# Patient Record
Sex: Male | Born: 1935 | Race: White | Hispanic: No | Marital: Married | Smoking: Former smoker
Health system: Southern US, Community
[De-identification: ages and names within clinical notes are randomized; demographics above are authoritative.]

## PROBLEM LIST (undated history)

## (undated) DIAGNOSIS — I219 Acute myocardial infarction, unspecified: Secondary | ICD-10-CM

## (undated) DIAGNOSIS — E039 Hypothyroidism, unspecified: Secondary | ICD-10-CM

## (undated) DIAGNOSIS — E119 Type 2 diabetes mellitus without complications: Secondary | ICD-10-CM

## (undated) DIAGNOSIS — I2510 Atherosclerotic heart disease of native coronary artery without angina pectoris: Secondary | ICD-10-CM

## (undated) DIAGNOSIS — E785 Hyperlipidemia, unspecified: Secondary | ICD-10-CM

## (undated) DIAGNOSIS — E079 Disorder of thyroid, unspecified: Secondary | ICD-10-CM

## (undated) DIAGNOSIS — I10 Essential (primary) hypertension: Secondary | ICD-10-CM

## (undated) HISTORY — DX: Essential (primary) hypertension: I10

## (undated) HISTORY — DX: Hyperlipidemia, unspecified: E78.5

## (undated) HISTORY — PX: CARDIAC CATHETERIZATION: SHX172

## (undated) HISTORY — DX: Atherosclerotic heart disease of native coronary artery without angina pectoris: I25.10

## (undated) HISTORY — DX: Type 2 diabetes mellitus without complications: E11.9

## (undated) HISTORY — DX: Acute myocardial infarction, unspecified: I21.9

## (undated) HISTORY — PX: CATARACT EXTRACTION W/ INTRAOCULAR LENS IMPLANT: SHX1309

## (undated) HISTORY — PX: HERNIA REPAIR: SHX51

## (undated) HISTORY — DX: Disorder of thyroid, unspecified: E07.9

## (undated) HISTORY — PX: STENT PLACEMENT VASCULAR (ARMC HX): HXRAD1737

---

## 1997-11-16 ENCOUNTER — Other Ambulatory Visit: Admission: RE | Admit: 1997-11-16 | Discharge: 1997-11-16 | Payer: Self-pay

## 1998-08-25 ENCOUNTER — Encounter: Payer: Self-pay | Admitting: Emergency Medicine

## 1998-08-25 ENCOUNTER — Inpatient Hospital Stay (HOSPITAL_COMMUNITY): Admission: EM | Admit: 1998-08-25 | Discharge: 1998-08-28 | Payer: Self-pay | Admitting: Emergency Medicine

## 1998-08-30 ENCOUNTER — Encounter: Admission: RE | Admit: 1998-08-30 | Discharge: 1998-08-30 | Payer: Self-pay | Admitting: Family Medicine

## 2001-07-29 ENCOUNTER — Inpatient Hospital Stay (HOSPITAL_COMMUNITY): Admission: EM | Admit: 2001-07-29 | Discharge: 2001-08-02 | Payer: Self-pay | Admitting: *Deleted

## 2001-07-29 ENCOUNTER — Encounter: Payer: Self-pay | Admitting: Emergency Medicine

## 2001-07-30 ENCOUNTER — Encounter: Payer: Self-pay | Admitting: Cardiology

## 2007-03-07 ENCOUNTER — Emergency Department: Payer: Self-pay | Admitting: Emergency Medicine

## 2007-03-08 ENCOUNTER — Other Ambulatory Visit: Payer: Self-pay

## 2014-10-24 DIAGNOSIS — H2510 Age-related nuclear cataract, unspecified eye: Secondary | ICD-10-CM | POA: Diagnosis not present

## 2014-10-24 DIAGNOSIS — H4010X1 Unspecified open-angle glaucoma, mild stage: Secondary | ICD-10-CM | POA: Diagnosis not present

## 2014-11-07 DIAGNOSIS — E039 Hypothyroidism, unspecified: Secondary | ICD-10-CM | POA: Diagnosis not present

## 2014-11-07 DIAGNOSIS — G039 Meningitis, unspecified: Secondary | ICD-10-CM | POA: Diagnosis not present

## 2014-11-07 DIAGNOSIS — Z6827 Body mass index (BMI) 27.0-27.9, adult: Secondary | ICD-10-CM | POA: Diagnosis not present

## 2014-11-07 DIAGNOSIS — E785 Hyperlipidemia, unspecified: Secondary | ICD-10-CM | POA: Diagnosis not present

## 2014-11-07 DIAGNOSIS — N183 Chronic kidney disease, stage 3 (moderate): Secondary | ICD-10-CM | POA: Diagnosis not present

## 2014-11-07 DIAGNOSIS — I10 Essential (primary) hypertension: Secondary | ICD-10-CM | POA: Diagnosis not present

## 2014-11-14 DIAGNOSIS — Z6827 Body mass index (BMI) 27.0-27.9, adult: Secondary | ICD-10-CM | POA: Diagnosis not present

## 2014-11-14 DIAGNOSIS — E119 Type 2 diabetes mellitus without complications: Secondary | ICD-10-CM | POA: Diagnosis not present

## 2014-12-12 DIAGNOSIS — Z6826 Body mass index (BMI) 26.0-26.9, adult: Secondary | ICD-10-CM | POA: Diagnosis not present

## 2014-12-12 DIAGNOSIS — E1129 Type 2 diabetes mellitus with other diabetic kidney complication: Secondary | ICD-10-CM | POA: Diagnosis not present

## 2014-12-12 DIAGNOSIS — I2510 Atherosclerotic heart disease of native coronary artery without angina pectoris: Secondary | ICD-10-CM | POA: Diagnosis not present

## 2015-01-31 ENCOUNTER — Ambulatory Visit: Payer: Self-pay | Admitting: Cardiology

## 2015-03-26 DIAGNOSIS — N400 Enlarged prostate without lower urinary tract symptoms: Secondary | ICD-10-CM | POA: Diagnosis not present

## 2015-03-26 DIAGNOSIS — E119 Type 2 diabetes mellitus without complications: Secondary | ICD-10-CM | POA: Diagnosis not present

## 2015-03-26 DIAGNOSIS — Z6826 Body mass index (BMI) 26.0-26.9, adult: Secondary | ICD-10-CM | POA: Diagnosis not present

## 2015-03-26 DIAGNOSIS — N183 Chronic kidney disease, stage 3 (moderate): Secondary | ICD-10-CM | POA: Diagnosis not present

## 2015-03-26 DIAGNOSIS — I2510 Atherosclerotic heart disease of native coronary artery without angina pectoris: Secondary | ICD-10-CM | POA: Diagnosis not present

## 2015-03-26 DIAGNOSIS — E039 Hypothyroidism, unspecified: Secondary | ICD-10-CM | POA: Diagnosis not present

## 2015-03-26 DIAGNOSIS — I10 Essential (primary) hypertension: Secondary | ICD-10-CM | POA: Diagnosis not present

## 2015-07-25 DIAGNOSIS — Z6826 Body mass index (BMI) 26.0-26.9, adult: Secondary | ICD-10-CM | POA: Diagnosis not present

## 2015-07-25 DIAGNOSIS — E1142 Type 2 diabetes mellitus with diabetic polyneuropathy: Secondary | ICD-10-CM | POA: Diagnosis not present

## 2015-07-25 DIAGNOSIS — E1140 Type 2 diabetes mellitus with diabetic neuropathy, unspecified: Secondary | ICD-10-CM | POA: Diagnosis not present

## 2015-07-25 DIAGNOSIS — I10 Essential (primary) hypertension: Secondary | ICD-10-CM | POA: Diagnosis not present

## 2015-07-25 DIAGNOSIS — E1129 Type 2 diabetes mellitus with other diabetic kidney complication: Secondary | ICD-10-CM | POA: Diagnosis not present

## 2015-07-25 DIAGNOSIS — N183 Chronic kidney disease, stage 3 (moderate): Secondary | ICD-10-CM | POA: Diagnosis not present

## 2015-07-25 DIAGNOSIS — E785 Hyperlipidemia, unspecified: Secondary | ICD-10-CM | POA: Diagnosis not present

## 2015-07-25 DIAGNOSIS — I2510 Atherosclerotic heart disease of native coronary artery without angina pectoris: Secondary | ICD-10-CM | POA: Diagnosis not present

## 2016-01-23 DIAGNOSIS — E785 Hyperlipidemia, unspecified: Secondary | ICD-10-CM | POA: Diagnosis not present

## 2016-01-23 DIAGNOSIS — E1142 Type 2 diabetes mellitus with diabetic polyneuropathy: Secondary | ICD-10-CM | POA: Diagnosis not present

## 2016-01-23 DIAGNOSIS — N183 Chronic kidney disease, stage 3 (moderate): Secondary | ICD-10-CM | POA: Diagnosis not present

## 2016-01-23 DIAGNOSIS — E1129 Type 2 diabetes mellitus with other diabetic kidney complication: Secondary | ICD-10-CM | POA: Diagnosis not present

## 2016-01-23 DIAGNOSIS — Z6826 Body mass index (BMI) 26.0-26.9, adult: Secondary | ICD-10-CM | POA: Diagnosis not present

## 2016-01-23 DIAGNOSIS — I2510 Atherosclerotic heart disease of native coronary artery without angina pectoris: Secondary | ICD-10-CM | POA: Diagnosis not present

## 2016-01-23 DIAGNOSIS — I10 Essential (primary) hypertension: Secondary | ICD-10-CM | POA: Diagnosis not present

## 2016-01-23 DIAGNOSIS — E1149 Type 2 diabetes mellitus with other diabetic neurological complication: Secondary | ICD-10-CM | POA: Diagnosis not present

## 2016-06-02 DIAGNOSIS — E119 Type 2 diabetes mellitus without complications: Secondary | ICD-10-CM | POA: Diagnosis not present

## 2016-06-02 DIAGNOSIS — E039 Hypothyroidism, unspecified: Secondary | ICD-10-CM | POA: Diagnosis not present

## 2016-06-02 DIAGNOSIS — Z1389 Encounter for screening for other disorder: Secondary | ICD-10-CM | POA: Diagnosis not present

## 2016-06-02 DIAGNOSIS — Z9181 History of falling: Secondary | ICD-10-CM | POA: Diagnosis not present

## 2016-06-02 DIAGNOSIS — K4020 Bilateral inguinal hernia, without obstruction or gangrene, not specified as recurrent: Secondary | ICD-10-CM | POA: Diagnosis not present

## 2016-06-02 DIAGNOSIS — I2510 Atherosclerotic heart disease of native coronary artery without angina pectoris: Secondary | ICD-10-CM | POA: Diagnosis not present

## 2016-06-02 DIAGNOSIS — Z6827 Body mass index (BMI) 27.0-27.9, adult: Secondary | ICD-10-CM | POA: Diagnosis not present

## 2016-06-02 DIAGNOSIS — I10 Essential (primary) hypertension: Secondary | ICD-10-CM | POA: Diagnosis not present

## 2016-06-03 DIAGNOSIS — I10 Essential (primary) hypertension: Secondary | ICD-10-CM | POA: Diagnosis not present

## 2016-06-03 DIAGNOSIS — E039 Hypothyroidism, unspecified: Secondary | ICD-10-CM | POA: Diagnosis not present

## 2016-06-03 DIAGNOSIS — E119 Type 2 diabetes mellitus without complications: Secondary | ICD-10-CM | POA: Diagnosis not present

## 2016-06-09 ENCOUNTER — Encounter: Payer: Self-pay | Admitting: *Deleted

## 2016-06-24 ENCOUNTER — Ambulatory Visit (INDEPENDENT_AMBULATORY_CARE_PROVIDER_SITE_OTHER): Payer: Commercial Managed Care - HMO | Admitting: General Surgery

## 2016-06-24 ENCOUNTER — Encounter: Payer: Self-pay | Admitting: General Surgery

## 2016-06-24 VITALS — BP 130/74 | HR 70 | Resp 16 | Ht 70.0 in | Wt 162.0 lb

## 2016-06-24 DIAGNOSIS — K4020 Bilateral inguinal hernia, without obstruction or gangrene, not specified as recurrent: Secondary | ICD-10-CM | POA: Diagnosis not present

## 2016-06-24 DIAGNOSIS — Z1211 Encounter for screening for malignant neoplasm of colon: Secondary | ICD-10-CM | POA: Diagnosis not present

## 2016-06-24 DIAGNOSIS — K5900 Constipation, unspecified: Secondary | ICD-10-CM

## 2016-06-24 LAB — POC HEMOCCULT BLD/STL (OFFICE/1-CARD/DIAGNOSTIC): Fecal Occult Blood, POC: NEGATIVE

## 2016-06-24 NOTE — Patient Instructions (Addendum)
Inguinal Hernia, Adult Introduction An inguinal hernia is when fat or the intestines push through the area where the leg meets the lower abdomen (groin) and create a rounded lump (bulge). This condition develops over time. There are three types of inguinal hernias. These types include:  Hernias that can be pushed back into the belly (are reducible).  Hernias that are not reducible (are incarcerated).  Hernias that are not reducible and lose their blood supply (are strangulated). This type of hernia requires emergency surgery. What are the causes? This condition is caused by having a weak spot in the muscles or tissue. This weakness lets the hernia poke through. This condition can be triggered by:  Suddenly straining the muscles of the lower abdomen.  Lifting heavy objects.  Straining to have a bowel movement. Difficult bowel movements (constipation) can lead to this.  Coughing. What increases the risk? This condition is more likely to develop in:  Men.  Pregnant women.  People who:  Are overweight.  Work in jobs that require long periods of standing or heavy lifting.  Have had an inguinal hernia before.  Smoke or have lung disease. These factors can lead to long-lasting (chronic) coughing. What are the signs or symptoms? Symptoms can depend on the size of the hernia. Often, a small inguinal hernia has no symptoms. Symptoms of a larger hernia include:  A lump in the groin. This is easier to see when the person is standing. It might not be visible when he or she is lying down.  Pain or burning in the groin. This occurs especially when lifting, straining, or coughing.  A dull ache or a feeling of pressure in the groin.  A lump in the scrotum in men. Symptoms of a strangulated inguinal hernia can include:  A bulge in the groin that is very painful and tender to the touch.  A bulge that turns red or purple.  Fever, nausea, and vomiting.  The inability to have a bowel  movement or to pass gas. How is this diagnosed? This condition is diagnosed with a medical history and physical exam. Your health care provider may feel your groin area and ask you to cough. How is this treated? Treatment for this condition varies depending on the size of your hernia and whether you have symptoms. If you do not have symptoms, your health care provider may have you watch your hernia carefully and come in for follow-up visits. If your hernia is larger or if you have symptoms, your treatment will include surgery. Follow these instructions at home: Lifestyle  Drink enough fluid to keep your urine clear or pale yellow.  Eat a diet that includes a lot of fiber. Eat plenty of fruits, vegetables, and whole grains. Talk with your health care provider if you have questions.  Avoid lifting heavy objects.  Avoid standing for long periods of time.  Do not use tobacco products, including cigarettes, chewing tobacco, or e-cigarettes. If you need help quitting, ask your health care provider.  Maintain a healthy weight. General instructions  Do not try to force the hernia back in.  Watch your hernia for any changes in color or size. Let your health care provider know if any changes occur.  Take over-the-counter and prescription medicines only as told by your health care provider.  Keep all follow-up visits as told by your health care provider. This is important. Contact a health care provider if:  You have a fever.  You have new symptoms.  Your symptoms   get worse. Get help right away if:  You have pain in the groin that suddenly gets worse.  A bulge in the groin gets bigger suddenly and does not go down.  You are a man and you have a sudden pain in the scrotum, or the size of your scrotum suddenly changes.  A bulge in the groin area becomes red or purple and is painful to the touch.  You have nausea or vomiting that does not go away.  You feel your heart beating a lot  more quickly than normal.  You cannot have a bowel movement or pass gas. This information is not intended to replace advice given to you by your health care provider. Make sure you discuss any questions you have with your health care provider. Document Released: 11/22/2008 Document Revised: 12/12/2015 Document Reviewed: 05/16/2014  2017 Elsevier Colonoscopy, Adult A colonoscopy is an exam to look at the entire large intestine. During the exam, a lubricated, bendable tube is inserted into the anus and then passed into the rectum, colon, and other parts of the large intestine. A colonoscopy is often done as a part of normal colorectal screening or in response to certain symptoms, such as anemia, persistent diarrhea, abdominal pain, and blood in the stool. The exam can help screen for and diagnose medical problems, including:  Tumors.  Polyps.  Inflammation.  Areas of bleeding. Tell a health care provider about:  Any allergies you have.  All medicines you are taking, including vitamins, herbs, eye drops, creams, and over-the-counter medicines.  Any problems you or family members have had with anesthetic medicines.  Any blood disorders you have.  Any surgeries you have had.  Any medical conditions you have.  Any problems you have had passing stool. What are the risks? Generally, this is a safe procedure. However, problems may occur, including:  Bleeding.  A tear in the intestine.  A reaction to medicines given during the exam.  Infection (rare). What happens before the procedure? Eating and drinking restrictions  Follow instructions from your health care provider about eating and drinking, which may include:  A few days before the procedure - follow a low-fiber diet. Avoid nuts, seeds, dried fruit, raw fruits, and vegetables.  1-3 days before the procedure - follow a clear liquid diet. Drink only clear liquids, such as clear broth or bouillon, black coffee or tea, clear  juice, clear soft drinks or sports drinks, gelatin desert, and popsicles. Avoid any liquids that contain red or purple dye.  On the day of the procedure - do not eat or drink anything during the 2 hours before the procedure, or within the time period that your health care provider recommends. Bowel prep  If you were prescribed an oral bowel prep to clean out your colon:  Take it as told by your health care provider. Starting the day before your procedure, you will need to drink a large amount of medicated liquid. The liquid will cause you to have multiple loose stools until your stool is almost clear or light green.  If your skin or anus gets irritated from diarrhea, you may use these to relieve the irritation:  Medicated wipes, such as adult wet wipes with aloe and vitamin E.  A skin soothing-product like petroleum jelly.  If you vomit while drinking the bowel prep, take a break for up to 60 minutes and then begin the bowel prep again. If vomiting continues and you cannot take the bowel prep without vomiting, call your health   care provider. General instructions  Ask your health care provider about changing or stopping your regular medicines. This is especially important if you are taking diabetes medicines or blood thinners.  Plan to have someone take you home from the hospital or clinic. What happens during the procedure?  An IV tube may be inserted into one of your veins.  You will be given medicine to help you relax (sedative).  To reduce your risk of infection:  Your health care team will wash or sanitize their hands.  Your anal area will be washed with soap.  You will be asked to lie on your side with your knees bent.  Your health care provider will lubricate a long, thin, flexible tube. The tube will have a camera and a light on the end.  The tube will be inserted into your anus.  The tube will be gently eased through your rectum and colon.  Air will be delivered into  your colon to keep it open. You may feel some pressure or cramping.  The camera will be used to take images during the procedure.  A small tissue sample may be removed from your body to be examined under a microscope (biopsy). If any potential problems are found, the tissue will be sent to a lab for testing.  If small polyps are found, your health care provider may remove them and have them checked for cancer cells.  The tube that was inserted into your anus will be slowly removed. The procedure may vary among health care providers and hospitals. What happens after the procedure?  Your blood pressure, heart rate, breathing rate, and blood oxygen level will be monitored until the medicines you were given have worn off.  Do not drive for 24 hours after the exam.  You may have a small amount of blood in your stool.  You may pass gas and have mild abdominal cramping or bloating due to the air that was used to inflate your colon during the exam.  It is up to you to get the results of your procedure. Ask your health care provider, or the department performing the procedure, when your results will be ready. This information is not intended to replace advice given to you by your health care provider. Make sure you discuss any questions you have with your health care provider. Document Released: 07/03/2000 Document Revised: 01/24/2016 Document Reviewed: 09/17/2015 Elsevier Interactive Patient Education  2017 Elsevier Inc.  

## 2016-06-24 NOTE — Progress Notes (Signed)
Patient ID: Brandon LuzCoy L Din, male   DOB: 1929/12/24, 82 y.o.   MRN: [id number]  Chief Complaint  Patient presents with  . Other    inguinal hernia    HPI Brandon Ochoa is a 82 y.o. male here today for a evaluation of bilateral inguinal hernias. Patient states he noticed this about four months ago. He states the areas are getting larger and started to swell more in the last two months. He states he has been constipated for about two years now. Moves his bowels every two days allergy he has been making use of a stool softener.   The patient reports he felt this constipation developed at the same time as he was started on thyroid supplementation.  He has lost some weight since he retired over 10 years ago, reporting that he is doing less and therefore is eating less. No dramatic weight loss in the last year.  The patient has not done any unusual or particularly strenuous physical activity in the last 6 months that would've precipitated development of these large hernias. He does report he is straining at stool. Denies any urinary difficulties, denies nocturia.  Wife of 62 years, Carley Hammedva, Is present.  HPI   Past Medical History:  Diagnosis Date  . Arthritis   . Coronary artery disease   . Diabetes mellitus without complication (HCC)   . Heart attack   . Hyperlipidemia   . Hypertension   . Thyroid disease     Past Surgical History:  Procedure Laterality Date  . CARDIAC CATHETERIZATION     X1 stent [place]  . STENT PLACEMENT VASCULAR ([facility] HX)      Family History  Problem Relation Age of Onset  . Heart attack Father   . Heart attack Brother   . Heart disease Brother     Social History Social History  Substance Use Topics  . Smoking status: Former Smoker    Years: 15.00  . Smokeless tobacco: Never Used  . Alcohol use No    No Known Allergies  Current Outpatient Prescriptions  Medication Sig Dispense Refill  . doxazosin (CARDURA) 2 MG tablet Take 2 mg by mouth daily.      Marland Kitchen. latanoprost (XALATAN) 0.005 % ophthalmic solution 1 drop at bedtime.     . metFORMIN (GLUCOPHAGE) 500 MG tablet     . aspirin EC 81 MG tablet Take 81 mg by mouth daily.    Marland Kitchen. docusate sodium (COLACE) 100 MG capsule Take 100 mg by mouth 2 (two) times daily as needed for mild constipation.    . metoprolol succinate (TOPROL XL) 25 MG 24 hr tablet Take 1 tablet (25 mg total) by mouth daily. 30 tablet 6   No current facility-administered medications for this visit.     Review of Systems Review of Systems  Constitutional: Negative.   Respiratory: Negative.   Cardiovascular: Negative.   Gastrointestinal: Positive for constipation.    Blood pressure 130/74, pulse 70, resp. rate 16, height 5\' 10"  (1.778 m), weight 162 lb (73.5 kg).  Physical Exam Physical Exam  Constitutional: He is oriented to person, place, and time. He appears well-developed and well-nourished.  Eyes: Conjunctivae are normal.  Neck: Neck supple.  Cardiovascular: Normal rate, regular rhythm and normal heart sounds.   Pulmonary/Chest: Effort normal and breath sounds normal.  Abdominal: Soft. Normal appearance and bowel sounds are normal. There is no tenderness. A hernia is present. Hernia confirmed positive in the right inguinal area and confirmed positive in the  left inguinal area.  Genitourinary: Rectal exam shows guaiac negative stool.    Prostate is enlarged (smooth, nontender).  Lymphadenopathy:    He has no cervical adenopathy.  Neurological: He is alert and oriented to person, place, and time.  Skin: Skin is warm and dry.    Data Reviewed 06/02/2016 laboratory studies were reviewed. Scant elevation of the blood sugar 112, estimated GFR 59, normal electrolytes, hemoglobin A1c: 5.9. Hemoglobin 16.5, MCV 89, platelet count 127,000, white blood cell count 7800.  Assessment    Newly developed bilateral inguinal hernia.  Change in bowel habits.  Mild weight loss.    Plan    I explained to the patient  and his wife that it is unusual for someone to "out of the blue" develop such sizable, bilateral inguinal hernias without any precipitating event. His change in stool habits over the last 1 or 2 years has not been associated with any blood or mucus in the stools, but he has never had a colon screening exam. I suggested that this would be the most appropriate study to minimize the risk for recurrent hernias and missed malignancy.  He is cooled to the idea of a colonoscopy, at this time I told that we could proceed a hernia repair after cardiology clearance.  Considering his platelet count and the size of the hernias I think I would get a better repair with an open procedure.  Hernia precautions and incarceration were discussed with the patient. If they develop symptoms of an incarcerated hernia, they were encouraged to seek prompt medical attention.  I have recommended repair of the hernia using mesh on an outpatient basis in the near future. The risk of infection was reviewed. The role of prosthetic mesh to minimize the risk of recurrence was reviewed.  Colonoscopy with possible biopsy/polypectomy prn: Information regarding the procedure, including its potential risks and complications (including but not limited to perforation of the bowel, which may require emergency surgery to repair, and bleeding) was verbally given to the patient. Educational information regarding lower intestinal endoscopy was given to the patient. Written instructions for how to complete the bowel prep using Miralax were provided. The importance of drinking ample fluids to avoid dehydration as a result of the prep emphasized.  Patient will need to see cardiology before proceeding with surgery.   This patient has been scheduled for an appointment with Almond LintAileen Ingal at [facility] for 06-25-16 at 1 pm (arrive 12:45 pm).   This information has been scribed by Ples SpecterJessica Qualls CMA.   Earline MayotteByrnett, Nashonda Limberg W 06/25/2016, 3:39  PM

## 2016-06-25 ENCOUNTER — Encounter: Payer: Self-pay | Admitting: Cardiology

## 2016-06-25 ENCOUNTER — Ambulatory Visit (INDEPENDENT_AMBULATORY_CARE_PROVIDER_SITE_OTHER): Payer: Commercial Managed Care - HMO | Admitting: Cardiology

## 2016-06-25 VITALS — BP 143/84 | HR 75 | Ht 70.0 in | Wt 175.2 lb

## 2016-06-25 DIAGNOSIS — Z9861 Coronary angioplasty status: Secondary | ICD-10-CM | POA: Diagnosis not present

## 2016-06-25 DIAGNOSIS — I2510 Atherosclerotic heart disease of native coronary artery without angina pectoris: Secondary | ICD-10-CM

## 2016-06-25 DIAGNOSIS — I10 Essential (primary) hypertension: Secondary | ICD-10-CM

## 2016-06-25 DIAGNOSIS — Z1211 Encounter for screening for malignant neoplasm of colon: Secondary | ICD-10-CM | POA: Insufficient documentation

## 2016-06-25 DIAGNOSIS — Z01818 Encounter for other preprocedural examination: Secondary | ICD-10-CM

## 2016-06-25 DIAGNOSIS — K4020 Bilateral inguinal hernia, without obstruction or gangrene, not specified as recurrent: Secondary | ICD-10-CM | POA: Insufficient documentation

## 2016-06-25 DIAGNOSIS — K5900 Constipation, unspecified: Secondary | ICD-10-CM | POA: Insufficient documentation

## 2016-06-25 MED ORDER — METOPROLOL SUCCINATE ER 25 MG PO TB24: 25.0000 mg | ORAL_TABLET | Freq: Every day | ORAL | 6 refills | Status: DC

## 2016-06-25 NOTE — Progress Notes (Signed)
Cardiology Office Note   Date:  06/25/2016   ID:  Brandon LuzCoy L Petruska, DOB 09-10-1929, MRN [id number]  Referring Doctor:  Pcp Not In System   Cardiologist:   Almond LintAileen Bristol Soy, MD   Reason for consultation:  Chief Complaint  Patient presents with  . other    Cardiac clearance hernia repair/colonoscopy c/o edema. Meds reviewed verbally with pt.      History of Present Illness: Brandon Ochoa is a 82 y.o. male who presents for Preoperative cardiac evaluation.  Patient reports that he did have a heart attack many years ago, received a cardiac stent. Has not really seen a cardiologist after being admitted in the hospital.  Patient has not been regularly taking his aspirin. He was on statin medication before but has stopped taking. He does not know for how long.  He denies chest pain, shortness of breath, PND, orthopnea, palpitations.   ROS:  Please see the history of present illness. Aside from mentioned under HPI, all other systems are reviewed and negative.     Past Medical History:  Diagnosis Date  . Arthritis   . Coronary artery disease   . Diabetes mellitus without complication (HCC)   . Heart attack   . Hyperlipidemia   . Hypertension   . Thyroid disease     Past Surgical History:  Procedure Laterality Date  . CARDIAC CATHETERIZATION     X1 stent [place]  . STENT PLACEMENT VASCULAR ([facility] HX)       reports that he has quit smoking. He quit after 15.00 years of use. He has never used smokeless tobacco. He reports that he does not drink alcohol or use drugs.   family history includes Heart attack in his brother and father; Heart disease in his brother.   Outpatient Medications Prior to Visit  Medication Sig Dispense Refill  . doxazosin (CARDURA) 2 MG tablet Take 2 mg by mouth daily.     Marland Kitchen. latanoprost (XALATAN) 0.005 % ophthalmic solution 1 drop at bedtime.     . metFORMIN (GLUCOPHAGE) 500 MG tablet     . levothyroxine (SYNTHROID, LEVOTHROID) 125 MCG tablet       No facility-administered medications prior to visit.      Allergies: Patient has no known allergies.    PHYSICAL EXAM: VS:  BP (!) 143/84 (BP Location: Left Arm, Patient Position: Sitting, Cuff Size: Normal)   Pulse 75   Ht 5\' 10"  (1.778 m)   Wt 175 lb 4 oz (79.5 kg)   BMI 25.15 kg/m  , Body mass index is 25.15 kg/m. Wt Readings from Last 3 Encounters:  06/25/16 175 lb 4 oz (79.5 kg)  06/24/16 162 lb (73.5 kg)    GENERAL:  well developed, well nourished, not in acute distress HEENT: normocephalic, pink conjunctivae, anicteric sclerae, no xanthelasma, normal dentition, oropharynx clear NECK:  no neck vein engorgement, JVP normal, no hepatojugular reflux, carotid upstroke brisk and symmetric, no bruit, no thyromegaly, no lymphadenopathy LUNGS:  good respiratory effort, clear to auscultation bilaterally CV:  PMI not displaced, no thrills, no lifts, S1 and S2 within normal limits, no palpable S3 or S4, no murmurs, no rubs, no gallops ABD:  Soft, nontender, nondistended, normoactive bowel sounds, no abdominal aortic bruit, no hepatomegaly, no splenomegaly MS: nontender back, no kyphosis, no scoliosis, no joint deformities EXT:  2+ DP/PT pulses, no edema, no varicosities, no cyanosis, no clubbing SKIN: warm, nondiaphoretic, normal turgor, no ulcers NEUROPSYCH: alert, oriented to person, place, and time, sensory/motor  grossly intact, normal mood, appropriate affect  Recent Labs: No results found for requested labs within last 8760 hours.   Lipid Panel No results found for: CHOL, TRIG, HDL, CHOLHDL, VLDL, LDLCALC, LDLDIRECT   Other studies Reviewed:  EKG:  The ekg from 06/25/2016 was personally reviewed by me and it revealed sinus rhythm, PAC. 75 BPM.  Additional studies/ records that were reviewed personally reviewed by me today include: None available   ASSESSMENT AND PLAN: CAD status post PCI Preoperative cardiac evaluation Unfortunately, patient has not been following up  with the cardiologist since diagnosis many years ago. He has not been consistently taking aspirin nor statin medication. Recommend to start aspirin 81 mg by mouth daily, Toprol-XL 25 mg by mouth daily. Check lipid panel, depending on this we'll start statin therapy. Check echocardiogram. Pharmacologic nuclear stress test for evaluation.  Hypertension Patient's blood pressure high today. Continue blood pressure log. We'll monitor now that were starting metoprolol.  Current medicines are reviewed at length with the patient today.  The patient does not have concerns regarding medicines.  Labs/ tests ordered today include:  Orders Placed This Encounter  Procedures  . NM Myocar Multi W/Spect W/Wall Motion / EF  . Lipid Profile  . EKG 12-Lead  . ECHOCARDIOGRAM COMPLETE    I had a lengthy and detailed discussion with the patient regarding diagnoses, prognosis, diagnostic options, treatment options , and side effects of medications.   I counseled the patient on importance of lifestyle modification including heart healthy diet, regular physical activity Once stress this is done..   Disposition:   FU with undersigned after tests  I spent at least 45minutes with the patient today and more than 50% of the time was spent counseling the patient and coordinating care.     Signed, Almond LintAileen Minela [place], MD  06/25/2016 1:47 PM     [facility]  This note was generated in part with voice recognition software and I apologize for any typographical errors that were not detected and corrected.

## 2016-06-25 NOTE — Patient Instructions (Addendum)
Medication Instructions:  Your physician has recommended you make the following change in your medication:  1. START Aspirin 81 mg Once daily 2. START Toprol XL (Metoprolol) 25 mg Once daily   Labwork: Your physician recommends that you return for lab work. Nothing to eat or drink after midnight the night before coming in to have these done. You may want to have it done the morning before your stress test. Take lab slip with you to [facility] to the hospital and check in at the front desk.    Testing/Procedures: Your physician has requested that you have an echocardiogram. Echocardiography is a painless test that uses sound waves to create images of your heart. It provides your doctor with information about the size and shape of your heart and how well your heart's chambers and valves are working. This procedure takes approximately one hour. There are no restrictions for this procedure.  [facility]  Your caregiver has ordered a Stress Test with nuclear imaging. The purpose of this test is to evaluate the blood supply to your heart muscle. This procedure is referred to as a "Non-Invasive Stress Test." This is because other than having an IV started in your vein, nothing is inserted or "invades" your body. Cardiac stress tests are done to find areas of poor blood flow to the heart by determining the extent of coronary artery disease (CAD). Some patients exercise on a treadmill, which naturally increases the blood flow to your heart, while others who are  unable to walk on a treadmill due to physical limitations have a pharmacologic/chemical stress agent called Lexiscan . This medicine will mimic walking on a treadmill by temporarily increasing your coronary blood flow.   Please note: these test may take anywhere between 2-4 hours to complete  PLEASE REPORT TO [facility]TR[facility]  THE VOLUNTEERS AT THE FIRST DESK WILL DIRECT YOU WHERE TO GO  Date of Procedure:_Tuesday June 30, 2016 at 08:00AM__  Arrival Time for Procedure:_Arrive at 07:45AM to register__  Instructions regarding medication:   _X__ : Hold diabetes medication Metformin (Glucophage) the morning of procedure  _X___:  Hold Toprol XL (Metoprolol) the night before procedure and morning of procedure  __X__:  Hold Doxazosin (Cardura) the morning of procedure.   PLEASE NOTIFY THE OFFICE AT LEAST 24 HOURS IN ADVANCE IF YOU ARE UNABLE TO KEEP YOUR APPOINTMENT.  [phone] AND  PLEASE NOTIFY NUCLEAR MEDICINE AT [facility] AT LEAST 24 HOURS IN ADVANCE IF YOU ARE UNABLE TO KEEP YOUR APPOINTMENT. [phone]  How to prepare for your Myoview test:  1. Do not eat or drink after midnight 2. No caffeine for 24 hours prior to test 3. No smoking 24 hours prior to test. 4. Your medication may be taken with water.  If your doctor stopped a medication because of this test, do not take that medication. 5. Ladies, please do not wear dresses.  Skirts or pants are appropriate. Please wear a short sleeve shirt. 6. No perfume, cologne or lotion. 7. Wear comfortable walking shoes. No heels!      Follow-Up: Your physician recommends that you schedule a follow-up appointment in: 3 months with Dr. Alvino Chapel.   It was a pleasure seeing you today here in the office. Please do not hesitate to give [place] a call back if you have any further questions. [phone]  [place] Cellar RN, BSN     Echocardiogram An echocardiogram, or echocardiography, uses sound waves (ultrasound) to produce an image of your heart. The  echocardiogram is simple, painless, obtained within a short period of time, and offers valuable information to your health care provider. The images from an echocardiogram can provide information such as:  Evidence of coronary artery disease (CAD).  Heart size.  Heart muscle function.  Heart valve function.  Aneurysm detection.  Evidence of a past heart attack.  Fluid buildup around the heart.  Heart muscle  thickening.  Assess heart valve function. Tell a health care provider about:  Any allergies you have.  All medicines you are taking, including vitamins, herbs, eye drops, creams, and over-the-counter medicines.  Any problems you or family members have had with anesthetic medicines.  Any blood disorders you have.  Any surgeries you have had.  Any medical conditions you have.  Whether you are pregnant or may be pregnant. What happens before the procedure? No special preparation is needed. Eat and drink normally. What happens during the procedure?  In order to produce an image of your heart, gel will be applied to your chest and a wand-like tool (transducer) will be moved over your chest. The gel will help transmit the sound waves from the transducer. The sound waves will harmlessly bounce off your heart to allow the heart images to be captured in real-time motion. These images will then be recorded.  You may need an IV to receive a medicine that improves the quality of the pictures. What happens after the procedure? You may return to your normal schedule including diet, activities, and medicines, unless your health care provider tells you otherwise. This information is not intended to replace advice given to you by your health care provider. Make sure you discuss any questions you have with your health care provider. Document Released: 07/03/2000 Document Revised: 02/22/2016 Document Reviewed: 03/13/2013 Elsevier Interactive Patient Education  2017 Elsevier Inc. Pharmacologic Stress Electrocardiogram A pharmacologic stress electrocardiogram is a heart (cardiac) test that uses nuclear imaging to evaluate the blood supply to your heart. This test may also be called a pharmacologic stress electrocardiography. Pharmacologic means that a medicine is used to increase your heart rate and blood pressure.  This stress test is done to find areas of poor blood flow to the heart by determining the  extent of coronary artery disease (CAD). Some people exercise on a treadmill, which naturally increases the blood flow to the heart. For those people unable to exercise on a treadmill, a medicine is used. This medicine stimulates your heart and will cause your heart to beat harder and more quickly, as if you were exercising.  Pharmacologic stress tests can help determine:  The adequacy of blood flow to your heart during increased levels of activity in order to clear you for discharge home.  The extent of coronary artery blockage caused by CAD.  Your prognosis if you have suffered a heart attack.  The effectiveness of cardiac procedures done, such as an angioplasty, which can increase the circulation in your coronary arteries.  Causes of chest pain or pressure. LET [facility] PROVIDER KNOW ABOUT:  Any allergies you have.  All medicines you are taking, including vitamins, herbs, eye drops, creams, and over-the-counter medicines.  Previous problems you or members of your family have had with the use of anesthetics.  Any blood disorders you have.  Previous surgeries you have had.  Medical conditions you have.  Possibility of pregnancy, if this applies.  If you are currently breastfeeding. RISKS AND COMPLICATIONS Generally, this is a safe procedure. However, as with any procedure, complications can  occur. Possible complications include:  You develop pain or pressure in the following areas:  Chest.  Jaw or neck.  Between your shoulder blades.  Radiating down your left arm.  Headache.  Dizziness or light-headedness.  Shortness of breath.  Increased or irregular heartbeat.  Low blood pressure.  Nausea or vomiting.  Flushing.  Redness going up the arm and slight pain during injection of medicine.  Heart attack (rare). BEFORE THE PROCEDURE   Avoid all forms of caffeine for 24 hours before your test or as directed by your health care provider. This includes  coffee, tea (even decaffeinated tea), caffeinated sodas, chocolate, cocoa, and certain pain medicines.  Follow your health care provider's instructions regarding eating and drinking before the test.  Take your medicines as directed at regular times with water unless instructed otherwise. Exceptions may include:  If you have diabetes, ask how you are to take your insulin or pills. It is common to adjust insulin dosing the morning of the test.  If you are taking beta-blocker medicines, it is important to talk to your health care provider about these medicines well before the date of your test. Taking beta-blocker medicines may interfere with the test. In some cases, these medicines need to be changed or stopped 24 hours or more before the test.  If you wear a nitroglycerin patch, it may need to be removed prior to the test. Ask your health care provider if the patch should be removed before the test.  If you use an inhaler for any breathing condition, bring it with you to the test.  If you are an outpatient, bring a snack so you can eat right after the stress phase of the test.  Do not smoke for 4 hours prior to the test or as directed by your health care provider.  Do not apply lotions, powders, creams, or oils on your chest prior to the test.  Wear comfortable shoes and clothing. Let your health care provider know if you were unable to complete or follow the preparations for your test. PROCEDURE   Multiple patches (electrodes) will be put on your chest. If needed, small areas of your chest may be shaved to get better contact with the electrodes. Once the electrodes are attached to your body, multiple wires will be attached to the electrodes, and your heart rate will be monitored.  An IV access will be started. A nuclear trace (isotope) is given. The isotope may be given intravenously, or it may be swallowed. Nuclear refers to several types of radioactive isotopes, and the nuclear isotope  lights up the arteries so that the nuclear images are clear. The isotope is absorbed by your body. This results in low radiation exposure.  A resting nuclear image is taken to show how your heart functions at rest.  A medicine is given through the IV access.  A second scan is done about 1 hour after the medicine injection and determines how your heart functions under stress.  During this stress phase, you will be connected to an electrocardiogram machine. Your blood pressure and oxygen levels will be monitored. AFTER THE PROCEDURE   Your heart rate and blood pressure will be monitored after the test.  You may return to your normal schedule, including diet,activities, and medicines, unless your health care provider tells you otherwise. This information is not intended to replace advice given to you by your health care provider. Make sure you discuss any questions you have with your health care provider. Document  Released: 11/22/2008 Document Revised: 07/11/2013 Document Reviewed: 03/13/2013 Elsevier Interactive Patient Education  2017 [employer].

## 2016-06-26 ENCOUNTER — Other Ambulatory Visit: Payer: Self-pay

## 2016-06-26 ENCOUNTER — Ambulatory Visit (INDEPENDENT_AMBULATORY_CARE_PROVIDER_SITE_OTHER): Payer: Commercial Managed Care - HMO

## 2016-06-26 ENCOUNTER — Telehealth: Payer: Self-pay | Admitting: Cardiology

## 2016-06-26 DIAGNOSIS — Z01818 Encounter for other preprocedural examination: Secondary | ICD-10-CM | POA: Diagnosis not present

## 2016-06-26 LAB — ECHOCARDIOGRAM COMPLETE
Ao-asc: 32 cm
CHL CUP DOP CALC LVOT VTI: 18.8 cm
E decel time: 232 msec
EERAT: 11.42
FS: 42 % (ref 28–44)
IVS/LV PW RATIO, ED: 0.73
LA ID, A-P, ES: 34 mm
LA diam end sys: 34 mm
LA diam index: 1.73 cm/m2
LA vol A4C: 28.2 ml
LAVOL: 29.7 mL
LAVOLIN: 15.1 mL/m2
LDCA: 2.84 cm2
LV E/e' medial: 11.42
LV E/e'average: 11.42
LV PW d: 10.4 mm — AB (ref 0.6–1.1)
LV TDI E'LATERAL: 5.44
LV TDI E'MEDIAL: 6.53
LV e' LATERAL: 5.44 cm/s
LVOTD: 19 mm
LVOTPV: 102 cm/s
LVOTSV: 53 mL
MV Dec: 232
MV pk A vel: 95.1 m/s
MV pk E vel: 62.1 m/s
RV LATERAL S' VELOCITY: 14.6 cm/s
TAPSE: 26.9 mm

## 2016-06-26 NOTE — Telephone Encounter (Signed)
Pt would like echo results 

## 2016-06-26 NOTE — Telephone Encounter (Signed)
Pt just had ECHO today @ 11:30, test has not been read yet.

## 2016-06-30 ENCOUNTER — Ambulatory Visit
Admission: RE | Admit: 2016-06-30 | Discharge: 2016-06-30 | Disposition: A | Discharge status: Home / Self Care | Payer: Commercial Managed Care - HMO | Source: Ambulatory Visit | Attending: Cardiology | Admitting: Cardiology

## 2016-06-30 ENCOUNTER — Other Ambulatory Visit
Admission: RE | Admit: 2016-06-30 | Discharge: 2016-06-30 | Disposition: A | Discharge status: Home / Self Care | Payer: Commercial Managed Care - HMO | Source: Ambulatory Visit | Attending: Cardiology | Admitting: Cardiology

## 2016-06-30 DIAGNOSIS — Z01818 Encounter for other preprocedural examination: Secondary | ICD-10-CM | POA: Diagnosis not present

## 2016-06-30 LAB — NM MYOCAR MULTI W/SPECT W/WALL MOTION / EF
CHL CUP STRESS STAGE 1 GRADE: 0 %
CHL CUP STRESS STAGE 1 HR: 71 {beats}/min
CHL CUP STRESS STAGE 2 SPEED: 0 mph
CHL CUP STRESS STAGE 3 GRADE: 0 %
CHL CUP STRESS STAGE 3 SPEED: 0 mph
CHL CUP STRESS STAGE 4 DBP: 76 mmHg
CHL CUP STRESS STAGE 4 GRADE: 0 %
CSEPPHR: 87 {beats}/min
CSEPPMHR: 63 %
Estimated workload: 1 METS
LV dias vol: 83 mL (ref 62–150)
LVSYSVOL: 44 mL
Percent HR: 69 %
Rest HR: 69 {beats}/min
Stage 1 Speed: 0 mph
Stage 2 Grade: 0 %
Stage 2 HR: 71 {beats}/min
Stage 3 HR: 87 {beats}/min
Stage 4 HR: 88 {beats}/min
Stage 4 SBP: 124 mmHg
Stage 4 Speed: 0 mph
TID: 0.91

## 2016-06-30 LAB — LIPID PANEL
Cholesterol: 221 mg/dL — ABNORMAL HIGH (ref 0–200)
HDL: 56 mg/dL (ref >40)
LDL CALC: 140 mg/dL — AB (ref 0–99)
TRIGLYCERIDES: 125 mg/dL (ref <150)
Total CHOL/HDL Ratio: 3.9 RATIO
VLDL: 25 mg/dL (ref 0–40)

## 2016-06-30 MED ORDER — REGADENOSON 0.4 MG/5ML IV SOLN
0.4000 mg | Freq: Once | INTRAVENOUS | Status: AC
  Administered 2016-06-30: 0.4 mg via INTRAVENOUS

## 2016-06-30 MED ORDER — TECHNETIUM TC 99M TETROFOSMIN IV KIT
32.2100 | PACK | Freq: Once | INTRAVENOUS | Status: AC | PRN
  Administered 2016-06-30: 32.21 via INTRAVENOUS

## 2016-06-30 MED ORDER — TECHNETIUM TC 99M TETROFOSMIN IV KIT
14.0700 | PACK | Freq: Once | INTRAVENOUS | Status: AC | PRN
  Administered 2016-06-30: 14.07 via INTRAVENOUS

## 2016-07-01 ENCOUNTER — Telehealth: Payer: Self-pay | Admitting: Cardiology

## 2016-07-01 NOTE — Telephone Encounter (Signed)
Pt daughter in law Jacki ConesLaurie calling stating we called patient yesterday with test results and patient did not really understand us  Would like us to call her back with those results Please call back

## 2016-07-01 NOTE — Telephone Encounter (Signed)
Patients daughter calling in to discuss results from yesterday. Reviewed echocardiogram and stress test results with her per release form and let her know that I forwarded note to physician to review for his cardiac clearance. She really wants patient to have his surgery prior to Christmas. Let her know that I would forward clearance to physician once they review his testing. She was appreciative for the call and had no further questions at this time.

## 2016-07-01 NOTE — Telephone Encounter (Signed)
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

## 2016-07-02 ENCOUNTER — Telehealth: Payer: Self-pay | Admitting: General Surgery

## 2016-07-02 ENCOUNTER — Other Ambulatory Visit: Payer: Self-pay | Admitting: General Surgery

## 2016-07-02 DIAGNOSIS — K4020 Bilateral inguinal hernia, without obstruction or gangrene, not specified as recurrent: Secondary | ICD-10-CM

## 2016-07-02 NOTE — Telephone Encounter (Signed)
Spoke with patients daughter and let her know that cardiac clearance was sent to surgeons office. She was appreciative for the call and had no further questions at this time.

## 2016-07-02 NOTE — Telephone Encounter (Signed)
I reviewed the cardiologist's report that his heart was stable with both the patient and his wife. We again went rounded around about the cause of his constipation, the patient being convinced it is related to thyroid medication prescribed last year. This is a very uncommon side effect, and I again had encouraged him to consider colonoscopy. Timing precludes having this done before the Christmas holidays, but we can have the left symptomatic inguinal hernia repaired before Christmas and then potentially come back after the first of the year and hopefully complete a colonoscopy and right inguinal hernia repair. This is acceptable to the patient and his wife. We'll try to schedule this for Tuesday, December 19.

## 2016-07-02 NOTE — Telephone Encounter (Signed)
Cardiac clearance routed through Epic with instructions to call if any questions.

## 2016-07-06 ENCOUNTER — Encounter
Admission: RE | Admit: 2016-07-06 | Discharge: 2016-07-06 | Disposition: A | Discharge status: Home / Self Care | Payer: Commercial Managed Care - HMO | Source: Ambulatory Visit | Attending: General Surgery | Admitting: General Surgery

## 2016-07-06 DIAGNOSIS — M1990 Unspecified osteoarthritis, unspecified site: Secondary | ICD-10-CM | POA: Diagnosis not present

## 2016-07-06 DIAGNOSIS — Z87891 Personal history of nicotine dependence: Secondary | ICD-10-CM | POA: Diagnosis not present

## 2016-07-06 DIAGNOSIS — E079 Disorder of thyroid, unspecified: Secondary | ICD-10-CM | POA: Diagnosis not present

## 2016-07-06 DIAGNOSIS — Z7982 Long term (current) use of aspirin: Secondary | ICD-10-CM | POA: Diagnosis not present

## 2016-07-06 DIAGNOSIS — I252 Old myocardial infarction: Secondary | ICD-10-CM | POA: Diagnosis not present

## 2016-07-06 DIAGNOSIS — E119 Type 2 diabetes mellitus without complications: Secondary | ICD-10-CM | POA: Diagnosis not present

## 2016-07-06 DIAGNOSIS — Z8249 Family history of ischemic heart disease and other diseases of the circulatory system: Secondary | ICD-10-CM | POA: Diagnosis not present

## 2016-07-06 DIAGNOSIS — K4090 Unilateral inguinal hernia, without obstruction or gangrene, not specified as recurrent: Secondary | ICD-10-CM | POA: Diagnosis not present

## 2016-07-06 DIAGNOSIS — I10 Essential (primary) hypertension: Secondary | ICD-10-CM | POA: Diagnosis not present

## 2016-07-06 DIAGNOSIS — E785 Hyperlipidemia, unspecified: Secondary | ICD-10-CM | POA: Diagnosis not present

## 2016-07-06 DIAGNOSIS — I2510 Atherosclerotic heart disease of native coronary artery without angina pectoris: Secondary | ICD-10-CM | POA: Diagnosis not present

## 2016-07-06 DIAGNOSIS — Z79899 Other long term (current) drug therapy: Secondary | ICD-10-CM | POA: Diagnosis not present

## 2016-07-06 HISTORY — DX: Hypothyroidism, unspecified: E03.9

## 2016-07-06 LAB — BASIC METABOLIC PANEL
ANION GAP: 5 (ref 5–15)
BUN: 19 mg/dL (ref 6–20)
CALCIUM: 8.4 mg/dL — AB (ref 8.9–10.3)
CO2: 28 mmol/L (ref 22–32)
CREATININE: 0.97 mg/dL (ref 0.61–1.24)
Chloride: 107 mmol/L (ref 101–111)
Glucose, Bld: 120 mg/dL — ABNORMAL HIGH (ref 65–99)
Potassium: 4.4 mmol/L (ref 3.5–5.1)
Sodium: 140 mmol/L (ref 135–145)

## 2016-07-06 LAB — CBC
HEMATOCRIT: 47.2 % (ref 40.0–52.0)
Hemoglobin: 15.8 g/dL (ref 13.0–18.0)
MCH: 30.2 pg (ref 26.0–34.0)
MCHC: 33.4 g/dL (ref 32.0–36.0)
MCV: 90.5 fL (ref 80.0–100.0)
PLATELETS: 105 10*3/uL — AB (ref 150–440)
RBC: 5.22 MIL/uL (ref 4.40–5.90)
RDW: 13.6 % (ref 11.5–14.5)
WBC: 8.4 10*3/uL (ref 3.8–10.6)

## 2016-07-06 NOTE — Patient Instructions (Signed)
Your procedure is scheduled on: July 07, 2016 Su procedimiento est programado para: Report to [facility] FLOOR Augustin Couperesntese a: To find out your arrival time please call [phone] between 1PM - 3PM on July 06, 2016 Para saber su hora de llegada por favor llame al [phone] entre la 1PM - 3PM el da:  Remember: Instructions that are not followed completely may result in serious medical risk, up to and including death, or upon the discretion of your surgeon and anesthesiologist your surgery may need to be rescheduled.  Recuerde: Las instrucciones que no se siguen completamente Armed forces logistics/support/administrative officerpueden resultar en un riesgo de salud grave, incluyendo hasta la [place] o a discrecin de su cirujano y Scientific laboratory techniciananestesilogo, su ciruga se puede posponer.   _X___ 1. Do not eat food or drink liquids after midnight. No gum chewing or hard candies.  No coma alimentos ni tome lquidos despus de la medianoche.  No mastique chicle ni caramelos  duros.     __X__ 2. No alcohol for 24 hours before or after surgery.    No tome alcohol durante las 24 horas antes ni despus de la [place].   __X__ 3. Bring all medications with you on the day of surgery if instructed. BRING ANY  NEW MEDICATIONS    Lleve todos los medicamentos con usted el da de su ciruga si se le ha indicado as.   __X__ 4. Notify your doctor if there is any change in your medical condition (cold, fever,                             infections).    Informe a su mdico si hay algn cambio en su condicin mdica (resfriado, fiebre, infecciones).   Do not wear jewelry, make-up, hairpins, clips or nail polish.  No use joyas, maquillajes, pinzas/ganchos para el cabello ni esmalte de uas.  Do not wear lotions, powders, or perfumes. You may wear deodorant.  No use lociones, polvos o perfumes.  Puede usar desodorante.    Do not shave 48 hours prior to surgery. Men may shave face and neck.  No se afeite 48 horas antes de la [place].  Los hombres  pueden Commercial Metals Companyafeitarse la cara y el cuello.   Do not bring valuables to the hospital.   No lleve objetos de valor al hospital.  [facility] not responsible for any belongings or valuables.  [place] no se hace responsable de ningn tipo de pertenencias u objetos de Licensed conveyancervalor.               Contacts, dentures or bridgework may not be worn into surgery.  Los lentes de La Jaracontacto, las dentaduras postizas o puentes no se pueden usar en la [place].  Leave your suitcase in the car. After surgery it may be brought to your room.  Deje su maleta en el auto.  Despus de la ciruga podr traerla a su habitacin.  For patients admitted to the hospital, discharge time is determined by your treatment team.  Para los pacientes que sean ingresados al hospital, el tiempo en el cual se le dar de alta es determinado por su                equipo de [place] Rivertratamiento.   Patients discharged the day of surgery will not be allowed to drive home. A los pacientes que se les da de alta el mismo da de la ciruga no se les permitir conducir a Higher education careers advisercasa.  Please read over the following fact sheets that you were given: Por favor lea las siguientes hojas de informacin que le dieron:  CHG SHEET INSTRUCTION   _X___ Take these medicines the morning of surgery with A SIP OF WATER:          Johnson & Johnsonome estas medicinas la maana de la ciruga con UN SORBO DE AGUA:  1. NONE  2.   3.   4.       5.  6.  ____ Fleet Enema (as directed)          Enema de Fleet (segn lo indicado)    _X___ Use CHG Soap as directed          Utilice el jabn de CHG segn lo indicado  ____ Use inhalers on the day of surgery          Use los inhaladores el da de la ciruga  ___X_ Stop metformin 2 days prior to surgery          Deje de tomar el metformin 2 das antes de la ciruga    ____ Take 1/2 of usual insulin dose the night before surgery and none on the morning of surgery           Tome la mitad de la dosis habitual de insulina la noche antes de la  [place] y no tome nada en la maana de la             ciruga  __X__ Stop Coumadin/Plavix/aspirin TODAY           Deje de tomar el Coumadin/Plavix/aspirina el da:  __X__ Stop Anti-inflammatories ADVIL, IBUPROFEN, ALEVE ANAPROX, GOODY'S, MOTRIN-- ONLY USE TYLENOL          Deje de tomar antiinflamatorios el da:   ____ Stop supplements until after surgery            Deje de tomar suplementos hasta despus de la ciruga  ____ Bring C-Pap to the hospital          Lleve el C-Pap al hospital

## 2016-07-07 ENCOUNTER — Ambulatory Visit: Payer: Commercial Managed Care - HMO | Admitting: Anesthesiology

## 2016-07-07 ENCOUNTER — Ambulatory Visit
Admission: RE | Admit: 2016-07-07 | Discharge: 2016-07-07 | Disposition: A | Discharge status: Home / Self Care | Payer: Commercial Managed Care - HMO | Source: Ambulatory Visit | Attending: General Surgery | Admitting: General Surgery

## 2016-07-07 ENCOUNTER — Encounter
Admission: RE | Disposition: A | Discharge status: Home / Self Care | Payer: Self-pay | Source: Ambulatory Visit | Attending: General Surgery

## 2016-07-07 ENCOUNTER — Encounter: Payer: Self-pay | Admitting: *Deleted

## 2016-07-07 DIAGNOSIS — Z87891 Personal history of nicotine dependence: Secondary | ICD-10-CM | POA: Insufficient documentation

## 2016-07-07 DIAGNOSIS — Z8249 Family history of ischemic heart disease and other diseases of the circulatory system: Secondary | ICD-10-CM | POA: Insufficient documentation

## 2016-07-07 DIAGNOSIS — E079 Disorder of thyroid, unspecified: Secondary | ICD-10-CM | POA: Diagnosis not present

## 2016-07-07 DIAGNOSIS — Z79899 Other long term (current) drug therapy: Secondary | ICD-10-CM | POA: Insufficient documentation

## 2016-07-07 DIAGNOSIS — E785 Hyperlipidemia, unspecified: Secondary | ICD-10-CM | POA: Diagnosis not present

## 2016-07-07 DIAGNOSIS — I2510 Atherosclerotic heart disease of native coronary artery without angina pectoris: Secondary | ICD-10-CM | POA: Diagnosis not present

## 2016-07-07 DIAGNOSIS — E119 Type 2 diabetes mellitus without complications: Secondary | ICD-10-CM | POA: Insufficient documentation

## 2016-07-07 DIAGNOSIS — K4090 Unilateral inguinal hernia, without obstruction or gangrene, not specified as recurrent: Secondary | ICD-10-CM | POA: Insufficient documentation

## 2016-07-07 DIAGNOSIS — I10 Essential (primary) hypertension: Secondary | ICD-10-CM | POA: Diagnosis not present

## 2016-07-07 DIAGNOSIS — Z7982 Long term (current) use of aspirin: Secondary | ICD-10-CM | POA: Insufficient documentation

## 2016-07-07 DIAGNOSIS — I252 Old myocardial infarction: Secondary | ICD-10-CM | POA: Diagnosis not present

## 2016-07-07 DIAGNOSIS — M1990 Unspecified osteoarthritis, unspecified site: Secondary | ICD-10-CM | POA: Diagnosis not present

## 2016-07-07 DIAGNOSIS — K4020 Bilateral inguinal hernia, without obstruction or gangrene, not specified as recurrent: Secondary | ICD-10-CM

## 2016-07-07 HISTORY — PX: INGUINAL HERNIA REPAIR: SHX194

## 2016-07-07 LAB — GLUCOSE, CAPILLARY: Glucose-Capillary: 114 mg/dL — ABNORMAL HIGH (ref 65–99)

## 2016-07-07 SURGERY — REPAIR, HERNIA, INGUINAL, ADULT
Anesthesia: General | Laterality: Left | Wound class: Clean

## 2016-07-07 MED ORDER — ACETAMINOPHEN 10 MG/ML IV SOLN
INTRAVENOUS | Status: DC | PRN
  Administered 2016-07-07: 1000 mg via INTRAVENOUS

## 2016-07-07 MED ORDER — LIDOCAINE HCL (CARDIAC) 20 MG/ML IV SOLN
INTRAVENOUS | Status: DC | PRN
  Administered 2016-07-07: 40 mg via INTRAVENOUS

## 2016-07-07 MED ORDER — PROPOFOL 10 MG/ML IV BOLUS
INTRAVENOUS | Status: DC | PRN
  Administered 2016-07-07: 140 mg via INTRAVENOUS

## 2016-07-07 MED ORDER — NEOSTIGMINE METHYLSULFATE 10 MG/10ML IV SOLN
INTRAVENOUS | Status: DC | PRN
  Administered 2016-07-07: 3 mg via INTRAVENOUS

## 2016-07-07 MED ORDER — CEFAZOLIN SODIUM-DEXTROSE 2-4 GM/100ML-% IV SOLN
2.0000 g | INTRAVENOUS | Status: AC
  Administered 2016-07-07: 2 g via INTRAVENOUS

## 2016-07-07 MED ORDER — SODIUM CHLORIDE 0.9 % IV SOLN
INTRAVENOUS | Status: DC
  Administered 2016-07-07 (×2): via INTRAVENOUS

## 2016-07-07 MED ORDER — ACETAMINOPHEN 10 MG/ML IV SOLN
INTRAVENOUS | Status: AC
  Filled 2016-07-07: qty 100

## 2016-07-07 MED ORDER — PHENYLEPHRINE HCL 10 MG/ML IJ SOLN
INTRAMUSCULAR | Status: DC | PRN
  Administered 2016-07-07 (×3): 100 ug via INTRAVENOUS
  Administered 2016-07-07 (×2): 200 ug via INTRAVENOUS

## 2016-07-07 MED ORDER — EPHEDRINE SULFATE 50 MG/ML IJ SOLN
INTRAMUSCULAR | Status: DC | PRN
  Administered 2016-07-07: 10 mg via INTRAVENOUS

## 2016-07-07 MED ORDER — FENTANYL CITRATE (PF) 100 MCG/2ML IJ SOLN
INTRAMUSCULAR | Status: DC | PRN
  Administered 2016-07-07: 50 ug via INTRAVENOUS

## 2016-07-07 MED ORDER — FENTANYL CITRATE (PF) 100 MCG/2ML IJ SOLN: 25.0000 ug | INTRAMUSCULAR | Status: DC | PRN

## 2016-07-07 MED ORDER — ROCURONIUM BROMIDE 100 MG/10ML IV SOLN: INTRAVENOUS | Status: DC | PRN

## 2016-07-07 MED ORDER — CEFAZOLIN SODIUM-DEXTROSE 2-4 GM/100ML-% IV SOLN
INTRAVENOUS | Status: AC
  Filled 2016-07-07: qty 100

## 2016-07-07 MED ORDER — BUPIVACAINE-EPINEPHRINE (PF) 0.25% -1:200000 IJ SOLN
INTRAMUSCULAR | Status: AC
  Filled 2016-07-07: qty 30

## 2016-07-07 MED ORDER — ROCURONIUM BROMIDE 100 MG/10ML IV SOLN
INTRAVENOUS | Status: DC | PRN
  Administered 2016-07-07: 30 mg via INTRAVENOUS
  Administered 2016-07-07: 10 mg via INTRAVENOUS

## 2016-07-07 MED ORDER — GLYCOPYRROLATE 0.2 MG/ML IJ SOLN
INTRAMUSCULAR | Status: DC | PRN
Start: 2016-07-07 — End: 2016-07-07
  Administered 2016-07-07: 0.4 mg via INTRAVENOUS

## 2016-07-07 MED ORDER — FAMOTIDINE 20 MG PO TABS
ORAL_TABLET | ORAL | Status: AC
  Filled 2016-07-07: qty 1

## 2016-07-07 MED ORDER — FAMOTIDINE 20 MG PO TABS
20.0000 mg | ORAL_TABLET | Freq: Once | ORAL | Status: AC
  Administered 2016-07-07: 20 mg via ORAL

## 2016-07-07 MED ORDER — BUPIVACAINE-EPINEPHRINE (PF) 0.25% -1:200000 IJ SOLN
INTRAMUSCULAR | Status: DC | PRN
  Administered 2016-07-07: 30 mL

## 2016-07-07 MED ORDER — HYDROCODONE-ACETAMINOPHEN 5-325 MG PO TABS: 1.0000 | ORAL_TABLET | ORAL | 0 refills | Status: DC | PRN

## 2016-07-07 MED ORDER — CEFAZOLIN SODIUM-DEXTROSE 2-3 GM-% IV SOLR
INTRAVENOUS | Status: DC | PRN
  Administered 2016-07-07: 2 g via INTRAVENOUS

## 2016-07-07 MED ORDER — ONDANSETRON HCL 4 MG/2ML IJ SOLN: 4.0000 mg | Freq: Once | INTRAMUSCULAR | Status: DC | PRN

## 2016-07-07 SURGICAL SUPPLY — 34 items
BLADE SURG 15 STRL SS SAFETY (BLADE) ×4 IMPLANT
CANISTER SUCT 1200ML W/VALVE (MISCELLANEOUS) ×3 IMPLANT
CHLORAPREP W/TINT 26ML (MISCELLANEOUS) ×3 IMPLANT
CLOSURE WOUND 1/2 X4 (GAUZE/BANDAGES/DRESSINGS) ×1
DECANTER SPIKE VIAL GLASS SM (MISCELLANEOUS) ×3 IMPLANT
DRAIN PENROSE 1/4X12 LTX (DRAIN) ×3 IMPLANT
DRAPE LAPAROTOMY 100X77 ABD (DRAPES) ×3 IMPLANT
DRESSING TELFA 4X3 1S ST N-ADH (GAUZE/BANDAGES/DRESSINGS) ×3 IMPLANT
DRSG TEGADERM 4X4.75 (GAUZE/BANDAGES/DRESSINGS) ×3 IMPLANT
ELECT REM PT RETURN 9FT ADLT (ELECTROSURGICAL) ×3
ELECTRODE REM PT RTRN 9FT ADLT (ELECTROSURGICAL) ×1 IMPLANT
GLOVE BIO SURGEON STRL SZ7.5 (GLOVE) ×7 IMPLANT
GLOVE INDICATOR 8.0 STRL GRN (GLOVE) ×5 IMPLANT
GOWN STRL REUS W/ TWL LRG LVL3 (GOWN DISPOSABLE) ×2 IMPLANT
GOWN STRL REUS W/TWL LRG LVL3 (GOWN DISPOSABLE) ×6
KIT RM TURNOVER STRD PROC AR (KITS) ×3 IMPLANT
LABEL OR SOLS (LABEL) ×3 IMPLANT
MESH HERNIA SYS ULTRAPRO LRG (Mesh General) ×3 IMPLANT
NDL HYPO 25X1 1.5 SAFETY (NEEDLE) ×1 IMPLANT
NDL SAFETY 22GX1.5 (NEEDLE) ×4 IMPLANT
NEEDLE HYPO 25X1 1.5 SAFETY (NEEDLE) ×3 IMPLANT
PACK BASIN MINOR ARMC (MISCELLANEOUS) ×3 IMPLANT
STRIP CLOSURE SKIN 1/2X4 (GAUZE/BANDAGES/DRESSINGS) ×2 IMPLANT
SUT SURGILON 0 BLK (SUTURE) ×6 IMPLANT
SUT VIC AB 2-0 SH 27 (SUTURE) ×3
SUT VIC AB 2-0 SH 27XBRD (SUTURE) ×1 IMPLANT
SUT VIC AB 3-0 54X BRD REEL (SUTURE) ×1 IMPLANT
SUT VIC AB 3-0 BRD 54 (SUTURE) ×3
SUT VIC AB 3-0 SH 27 (SUTURE) ×3
SUT VIC AB 3-0 SH 27X BRD (SUTURE) ×1 IMPLANT
SUT VIC AB 4-0 FS2 27 (SUTURE) ×3 IMPLANT
SWABSTK COMLB BENZOIN TINCTURE (MISCELLANEOUS) ×3 IMPLANT
SYR 3ML LL SCALE MARK (SYRINGE) ×3 IMPLANT
SYR CONTROL 10ML (SYRINGE) ×6 IMPLANT

## 2016-07-07 NOTE — H&P (Signed)
Patient presented with new onset bilateral hernias with history of new onset constipation. Left side more symptomatic at office visit, asymptomatic today.  Plan: Left inguinal hernia repair.

## 2016-07-07 NOTE — Anesthesia Procedure Notes (Signed)
Procedure Name: Intubation Date/Time: 07/07/2016 10:15 AM Performed by: Henrietta HooverPOPE, Aislin Onofre Pre-anesthesia Checklist: Patient identified, Emergency Drugs available, Suction available, Patient being monitored and Timeout performed Patient Re-evaluated:Patient Re-evaluated prior to inductionOxygen Delivery Method: Circle system utilized Preoxygenation: Pre-oxygenation with 100% oxygen Intubation Type: IV induction Ventilation: Mask ventilation without difficulty Laryngoscope Size: Mac and 3 Grade View: Grade I Tube type: Oral Tube size: 7.0 mm Number of attempts: 1 Airway Equipment and Method: Stylet Placement Confirmation: ETT inserted through vocal cords under direct vision Secured at: 22 cm Tube secured with: Tape Dental Injury: Teeth and Oropharynx as per pre-operative assessment  Comments: Distant breath sounds bilaterally

## 2016-07-07 NOTE — Discharge Instructions (Signed)

## 2016-07-07 NOTE — Transfer of Care (Signed)
Immediate Anesthesia Transfer of Care Note  Patient: Brandon Ochoa  Procedure(s) Performed: Procedure(s): HERNIA REPAIR INGUINAL ADULT (Left)  Patient Location: PACU  Anesthesia Type:General  Level of Consciousness: sedated  Airway & Oxygen Therapy: Patient Spontanous Breathing and Patient connected to face mask oxygen  Post-op Assessment: Report given to RN and Post -op Vital signs reviewed and stable  Post vital signs: Reviewed and stable  Last Vitals:  Vitals:   07/07/16 0920 07/07/16 1136  BP: (!) 173/95 135/71  Pulse: 69 72  Resp: 18 20  Temp: 36.4 C 36.2 C    Complications: No apparent anesthesia complications

## 2016-07-07 NOTE — Op Note (Signed)
Preoperative diagnosis: Left inguinal hernia.  Postoperative diagnosis: Left sliding inguinal hernia including the sigmoid colon.  Operative procedure: Repair of left indirect inguinal hernia with large Ultra Pro mesh.  Operating surgeon: Lane HackerJeffery Chiquita Heckert, M.D.  Anesthesia: Gen. endotracheal, Marcaine 0.25% with 1-200,000 of epinephrine, 30 mL; Toradol 30 mg.  Estimated blood loss: Less than 5 mL.  Clinical note: This 82 year old male 1 year history of constipation and a 4 month history of bilateral inguinal hernias. He declined preprocedure colonoscopy. His left side was more symptomatic and was elected to complete this side first.  He was removed from the surgical site with clippers. He received Kefzol intravenously prior to the procedure.  Operative note: With the patient under adequate general endotracheal anesthesia the lower abdomen on the left was prepped with ChloraPrep and draped. Field block anesthesia was established for postoperative analgesia. A 5 cm skin line incision along the anticipated course of the inguinal canal was carried out the skin subcutaneous tissue with hemostasis achieved by electrocautery. The external oblique was opened in direction of its fibers. A massive hernia sac was identified. The cord was mobilized and it was found that he had a sliding hernia. This was freed circumferentially and the preperitoneal space. A large ultra Pro mesh was smoothed into the preperitoneal space and the external component laid along the floor the inguinal canal. This was anchored to the pubic tubercle with 0 Surgilon. Interrupted 0 Surgilon sutures were used on the lower border to fix the mesh to the inguinal ligament. Similar sutures were used to anchor the medial and superior borders to the transverse abdominis aponeurosis. A lateral slit was made for cord passage and closed with interrupted Surgilon sutures. The iliohypogastric nerve was identified and protected during the dissection.  The ilioinguinal nerve was not identified. Toradol was placed to the wound. The external oblique was closed with a running 2-0 Vicryl suture. Scarpa's fascia was closed with a running 3-0 Vicryl suture and the skin closed with a running 4-0 Vicryl subcuticular suture.  Benzoin, Steri-Strips followed by Telfa and Tegaderm dressings were applied.  The patient tolerated the procedure well and was taken recovery in stable condition.

## 2016-07-07 NOTE — Anesthesia Preprocedure Evaluation (Signed)
Anesthesia Evaluation  Patient identified by MRN, date of birth, ID band Patient awake    Reviewed: Allergy & Precautions, H&P , NPO status , Patient's Chart, lab work & pertinent test results, reviewed documented beta blocker date and time   History of Anesthesia Complications Negative for: history of anesthetic complications  Airway Mallampati: II  TM Distance: >3 FB Neck ROM: full    Dental no notable dental hx. (+) Edentulous Upper, Edentulous Lower   Pulmonary neg pulmonary ROS, former smoker,    Pulmonary exam normal breath sounds clear to auscultation       Cardiovascular Exercise Tolerance: Good hypertension, (-) angina+ CAD, + Past MI and + Cardiac Stents  (-) CABG Normal cardiovascular exam(-) dysrhythmias (-) Valvular Problems/Murmurs Rhythm:regular Rate:Normal     Neuro/Psych negative neurological ROS  negative psych ROS   GI/Hepatic negative GI ROS, Neg liver ROS,   Endo/Other  diabetesHypothyroidism   Renal/GU negative Renal ROS  negative genitourinary   Musculoskeletal   Abdominal   Peds  Hematology negative hematology ROS (+)   Anesthesia Other Findings Past Medical History: No date: Coronary artery disease No date: Diabetes mellitus without complication (HCC) No date: Heart attack No date: Hyperlipidemia No date: Hypertension No date: Hypothyroidism No date: Thyroid disease   Reproductive/Obstetrics negative OB ROS                             Anesthesia Physical Anesthesia Plan  ASA: III  Anesthesia Plan: General   Post-op Pain Management:    Induction:   Airway Management Planned:   Additional Equipment:   Intra-op Plan:   Post-operative Plan:   Informed Consent: I have reviewed the patients History and Physical, chart, labs and discussed the procedure including the risks, benefits and alternatives for the proposed anesthesia with the patient or  authorized representative who has indicated his/her understanding and acceptance.   Dental Advisory Given  Plan Discussed with: Anesthesiologist, CRNA and Surgeon  Anesthesia Plan Comments:         Anesthesia Quick Evaluation

## 2016-07-07 NOTE — Anesthesia Postprocedure Evaluation (Signed)
Anesthesia Post Note  Patient: Brandon Ochoa  Procedure(s) Performed: Procedure(s) (LRB): HERNIA REPAIR INGUINAL ADULT (Left)  Patient location during evaluation: PACU Anesthesia Type: General Level of consciousness: awake and alert Pain management: pain level controlled Vital Signs Assessment: post-procedure vital signs reviewed and stable Respiratory status: spontaneous breathing, nonlabored ventilation, respiratory function stable and patient connected to nasal cannula oxygen Cardiovascular status: blood pressure returned to baseline and stable Postop Assessment: no signs of nausea or vomiting Anesthetic complications: no     Last Vitals:  Vitals:   07/07/16 1230 07/07/16 1245  BP: (!) 161/77 (!) 156/88  Pulse: 65   Resp: 15   Temp: 36.8 C     Last Pain:  Vitals:   07/07/16 1230  TempSrc:   PainSc: 0-No pain                 Lenard SimmerAndrew Juaquin [place]

## 2016-07-08 ENCOUNTER — Encounter: Payer: Self-pay | Admitting: General Surgery

## 2016-07-17 ENCOUNTER — Telehealth: Payer: Self-pay | Admitting: *Deleted

## 2016-07-17 MED ORDER — ATORVASTATIN CALCIUM 20 MG PO TABS: 20.0000 mg | ORAL_TABLET | Freq: Every day | ORAL | 3 refills | Status: DC

## 2016-07-17 NOTE — Telephone Encounter (Signed)
Reviewed results and recommendations with patient. He verbalized understanding and had no further questions at this time. Sent in prescription to [facility] per his request and let him know to please give us a call if he has any problems or further questions. He verbalized understanding of our conversation and had no further questions at this time.

## 2016-07-17 NOTE — Telephone Encounter (Signed)
-----   Message from Almond LintAileen Ingal, MD sent at 07/17/2016 12:51 PM EST ----- Recommend statin therapy, atorvastatin 20 mg by mouth daily at bedtime if patient willing. Check LFTs as well.

## 2016-07-22 ENCOUNTER — Encounter: Payer: Self-pay | Admitting: General Surgery

## 2016-07-23 ENCOUNTER — Ambulatory Visit: Payer: Commercial Managed Care - HMO | Admitting: General Surgery

## 2016-07-29 ENCOUNTER — Ambulatory Visit (INDEPENDENT_AMBULATORY_CARE_PROVIDER_SITE_OTHER): Payer: Medicare HMO | Admitting: General Surgery

## 2016-07-29 ENCOUNTER — Other Ambulatory Visit: Payer: Self-pay | Admitting: General Surgery

## 2016-07-29 ENCOUNTER — Other Ambulatory Visit: Payer: Self-pay | Admitting: *Deleted

## 2016-07-29 ENCOUNTER — Encounter: Payer: Self-pay | Admitting: General Surgery

## 2016-07-29 VITALS — BP 140/82 | HR 88 | Resp 14 | Ht 71.0 in | Wt 178.0 lb

## 2016-07-29 DIAGNOSIS — K4090 Unilateral inguinal hernia, without obstruction or gangrene, not specified as recurrent: Secondary | ICD-10-CM | POA: Insufficient documentation

## 2016-07-29 DIAGNOSIS — R32 Unspecified urinary incontinence: Secondary | ICD-10-CM | POA: Insufficient documentation

## 2016-07-29 NOTE — Progress Notes (Signed)
Patient[facility]. It is okay for patient to continue 81 mg aspirin once daily.   This patient recently had surgery completed on 07-07-16 which I believe will qualify patient for a phone interview.

## 2016-07-29 NOTE — Progress Notes (Signed)
Patient ID: Brandon Ochoa, male   DOB: 10-30-1929, 83 y.o.   MRN: [id number]  Chief Complaint  Patient presents with  . Routine Post Op    HPI Brandon Ochoa is a 83 y.o. male here today for his two week post op left inguinal hernia done 07/07/2016. Patient is doing fair, he is having trouble voiding because of the swelling. Report this developed today. I been told that he been voiding without much difficulty. Stool function appears to be improved since return of the sigmoid colon to the abdominal cavity post hernia repair. He is having bowels movements with eating applesauce.  He is here with his wife, Carley Hammed and daughter in law Vernona Rieger.  HPI  Past Medical History:  Diagnosis Date  . Coronary artery disease   . Diabetes mellitus without complication (HCC)   . Heart attack   . Hyperlipidemia   . Hypertension   . Hypothyroidism   . Thyroid disease     Past Surgical History:  Procedure Laterality Date  . CARDIAC CATHETERIZATION     X1 stent [place]  . CATARACT EXTRACTION W/ INTRAOCULAR LENS IMPLANT Right   . INGUINAL HERNIA REPAIR Left 07/07/2016   Procedure: HERNIA REPAIR INGUINAL ADULT;  Surgeon: Earline Mayotte, MD;  Location: [facility] ORS;  Service: General;  Laterality: Left;  . STENT PLACEMENT VASCULAR ([facility] HX)      Family History  Problem Relation Age of Onset  . Heart attack Father   . Heart attack Brother   . Heart disease Brother     Social History Social History  Substance Use Topics  . Smoking status: Former Smoker    Years: 15.00  . Smokeless tobacco: Never Used  . Alcohol use No    No Known Allergies  Current Outpatient Prescriptions  Medication Sig Dispense Refill  . aspirin EC 81 MG tablet Take 81 mg by mouth daily.    Marland Kitchen atorvastatin (LIPITOR) 20 MG tablet Take 1 tablet (20 mg total) by mouth daily at 6 PM. 30 tablet 3  . docusate sodium (COLACE) 100 MG capsule Take 100 mg by mouth 2 (two) times daily.     Marland Kitchen doxazosin (CARDURA) 2 MG tablet Take 3 mg  by mouth every evening.     . latanoprost (XALATAN) 0.005 % ophthalmic solution Place 1 drop into both eyes at bedtime.     . metFORMIN (GLUCOPHAGE) 500 MG tablet Take 500 mg by mouth daily.     . metoprolol succinate (TOPROL XL) 25 MG 24 hr tablet Take 1 tablet (25 mg total) by mouth daily. (Patient taking differently: Take 25 mg by mouth every evening. ) 30 tablet 6   No current facility-administered medications for this visit.     Review of Systems Review of Systems  Constitutional: Negative.   Respiratory: Negative.   Cardiovascular: Negative.   Gastrointestinal: Negative for constipation and diarrhea.  Genitourinary: Positive for difficulty urinating.    Blood pressure 140/82, pulse 88, resp. rate 14, height 5\' 11"  (1.803 m), weight 178 lb (80.7 kg).  Physical Exam Physical Exam  Constitutional: He is oriented to person, place, and time. He appears well-developed and well-nourished.  HENT:  Mouth/Throat: Oropharynx is clear and moist.  Eyes: Conjunctivae are normal. No scleral icterus.  Neck: Neck supple.  Cardiovascular: Normal rate, regular rhythm and normal heart sounds.   Pulmonary/Chest: Effort normal and breath sounds normal.  Abdominal: Soft. There is no tenderness. A hernia is present. Hernia confirmed positive in the right inguinal  area.  Left repair intact with swelling.  Genitourinary:     Lymphadenopathy:    He has no cervical adenopathy.  Neurological: He is alert and oriented to person, place, and time.  Skin: Skin is warm and dry.  Psychiatric: His behavior is normal.   Impression:  Doing well status post repair of left sliding hernia. Large right scrotal hernia. Voiding abnormalities secondary to large scrotal hernia. Clinical exam does not suggest obstruction.  Plan    I initially I push fairly hard to the patient is a colonoscopy, but this can be pushed out of the back burner as his bowel function has improved with repair of the sliding hernia.     Hernia precautions and incarceration were discussed with the patient. If they develop symptoms of an incarcerated hernia, they were encouraged to seek prompt medical attention.  I have recommended repair of the hernia using mesh on an outpatient basis in the near future. The risk of infection was reviewed. The role of prosthetic mesh to minimize the risk of recurrence was reviewed.   This information has been scribed by Dorathy DaftMarsha Hatch RN, BSN,BC.  Earline MayotteByrnett, Hameed Kolar W 07/29/2016, 10:20 AM

## 2016-07-29 NOTE — Patient Instructions (Signed)
The patient is aware to call back for any questions or concerns.  Hernia, Adult A hernia is the bulging of an organ or tissue through a weak spot in the muscles of the abdomen (abdominal wall). Hernias develop most often near the navel or groin. There are many kinds of hernias. Common kinds include:  Femoral hernia. This kind of hernia develops under the groin in the upper thigh area.  Inguinal hernia. This kind of hernia develops in the groin or scrotum.  Umbilical hernia. This kind of hernia develops near the navel.  Hiatal hernia. This kind of hernia causes part of the stomach to be pushed up into the chest.  Incisional hernia. This kind of hernia bulges through a scar from an abdominal surgery.  What are the causes? This condition may be caused by:  Heavy lifting.  Coughing over a long period of time.  Straining to have a bowel movement.  An incision made during an abdominal surgery.  A birth defect (congenital defect).  Excess weight or obesity.  Smoking.  Poor nutrition.  Cystic fibrosis.  Excess fluid in the abdomen.  Undescended testicles.  What are the signs or symptoms? Symptoms of a hernia include:  A lump on the abdomen. This is the first sign of a hernia. The lump may become more obvious with standing, straining, or coughing. It may get bigger over time if it is not treated or if the condition causing it is not treated.  Pain. A hernia is usually painless, but it may become painful over time if treatment is delayed. The pain is usually dull and may get worse with standing or lifting heavy objects.  Sometimes a hernia gets tightly squeezed in the weak spot (strangulated) or stuck there (incarcerated) and causes additional symptoms. These symptoms may include:  Vomiting.  Nausea.  Constipation.  Irritability.  How is this diagnosed? A hernia may be diagnosed with:  A physical exam. During the exam your health care provider may ask you to cough  or to make a specific movement, because a hernia is usually more visible when you move.  Imaging tests. These can include: ? X-rays. ? Ultrasound. ? CT scan.  How is this treated? A hernia that is small and painless may not need to be treated. A hernia that is large or painful may be treated with surgery. Inguinal hernias may be treated with surgery to prevent incarceration or strangulation. Strangulated hernias are always treated with surgery, because lack of blood to the trapped organ or tissue can cause it to die. Surgery to treat a hernia involves pushing the bulge back into place and repairing the weak part of the abdomen. Follow these instructions at home:  Avoid straining.  Do not lift anything heavier than 10 lb (4.5 kg).  Lift with your leg muscles, not your back muscles. This helps avoid strain.  When coughing, try to cough gently.  Prevent constipation. Constipation leads to straining with bowel movements, which can make a hernia worse or cause a hernia repair to break down. You can prevent constipation by: ? Eating a high-fiber diet that includes plenty of fruits and vegetables. ? Drinking enough fluids to keep your urine clear or pale yellow. Aim to drink 6-8 glasses of water per day. ? Using a stool softener as directed by your health care provider.  Lose weight, if you are overweight.  Do not use any tobacco products, including cigarettes, chewing tobacco, or electronic cigarettes. If you need help quitting, ask your health   care provider.  Keep all follow-up visits as directed by your health care provider. This is important. Your health care provider may need to monitor your condition. Contact a health care provider if:  You have swelling, redness, and pain in the affected area.  Your bowel habits change. Get help right away if:  You have a fever.  You have abdominal pain that is getting worse.  You feel nauseous or you vomit.  You cannot push the hernia back  in place by gently pressing on it while you are lying down.  The hernia: ? Changes in shape or size. ? Is stuck outside the abdomen. ? Becomes discolored. ? Feels hard or tender. This information is not intended to replace advice given to you by your health care provider. Make sure you discuss any questions you have with your health care provider. Document Released: 07/06/2005 Document Revised: 12/04/2015 Document Reviewed: 05/16/2014 Elsevier Interactive Patient Education  2017 Elsevier Inc.  

## 2016-07-31 ENCOUNTER — Inpatient Hospital Stay
Admission: RE | Admit: 2016-07-31 | Discharge: 2016-07-31 | Disposition: A | Discharge status: Home / Self Care | Payer: Commercial Managed Care - HMO | Source: Ambulatory Visit

## 2016-08-03 ENCOUNTER — Encounter
Admission: RE | Admit: 2016-08-03 | Discharge: 2016-08-03 | Disposition: A | Discharge status: Home / Self Care | Payer: Commercial Managed Care - HMO | Source: Ambulatory Visit | Attending: General Surgery | Admitting: General Surgery

## 2016-08-03 ENCOUNTER — Inpatient Hospital Stay: Admission: RE | Admit: 2016-08-03 | Payer: Commercial Managed Care - HMO | Source: Ambulatory Visit

## 2016-08-05 ENCOUNTER — Telehealth: Payer: Self-pay | Admitting: *Deleted

## 2016-08-05 MED ORDER — FAMOTIDINE 20 MG PO TABS
20.0000 mg | ORAL_TABLET | Freq: Once | ORAL | Status: AC
  Administered 2016-08-13: 20 mg via ORAL

## 2016-08-05 MED ORDER — SODIUM CHLORIDE 0.9 % IV SOLN
INTRAVENOUS | Status: DC
  Administered 2016-08-13: 10:00:00 via INTRAVENOUS

## 2016-08-05 MED ORDER — CEFAZOLIN SODIUM-DEXTROSE 2-4 GM/100ML-% IV SOLN: 2.0000 g | INTRAVENOUS | Status: AC

## 2016-08-05 NOTE — Telephone Encounter (Signed)
Patient cancelled his surgery that was scheduled for 08-05-16 due to the snow.   This patient has res[facility].

## 2016-08-13 ENCOUNTER — Encounter
Admission: RE | Disposition: A | Discharge status: Home / Self Care | Payer: Self-pay | Source: Ambulatory Visit | Attending: General Surgery

## 2016-08-13 ENCOUNTER — Ambulatory Visit: Payer: Medicare HMO | Admitting: Anesthesiology

## 2016-08-13 ENCOUNTER — Encounter: Payer: Self-pay | Admitting: General Surgery

## 2016-08-13 ENCOUNTER — Ambulatory Visit
Admission: RE | Admit: 2016-08-13 | Discharge: 2016-08-13 | Disposition: A | Discharge status: Home / Self Care | Payer: Medicare HMO | Source: Ambulatory Visit | Attending: General Surgery | Admitting: General Surgery

## 2016-08-13 DIAGNOSIS — I252 Old myocardial infarction: Secondary | ICD-10-CM | POA: Diagnosis not present

## 2016-08-13 DIAGNOSIS — Z955 Presence of coronary angioplasty implant and graft: Secondary | ICD-10-CM | POA: Diagnosis not present

## 2016-08-13 DIAGNOSIS — Z87891 Personal history of nicotine dependence: Secondary | ICD-10-CM | POA: Insufficient documentation

## 2016-08-13 DIAGNOSIS — K4090 Unilateral inguinal hernia, without obstruction or gangrene, not specified as recurrent: Secondary | ICD-10-CM | POA: Insufficient documentation

## 2016-08-13 DIAGNOSIS — I2510 Atherosclerotic heart disease of native coronary artery without angina pectoris: Secondary | ICD-10-CM | POA: Diagnosis not present

## 2016-08-13 DIAGNOSIS — Z7982 Long term (current) use of aspirin: Secondary | ICD-10-CM | POA: Insufficient documentation

## 2016-08-13 DIAGNOSIS — Z79899 Other long term (current) drug therapy: Secondary | ICD-10-CM | POA: Insufficient documentation

## 2016-08-13 DIAGNOSIS — E785 Hyperlipidemia, unspecified: Secondary | ICD-10-CM | POA: Insufficient documentation

## 2016-08-13 DIAGNOSIS — I10 Essential (primary) hypertension: Secondary | ICD-10-CM | POA: Diagnosis not present

## 2016-08-13 DIAGNOSIS — Z8249 Family history of ischemic heart disease and other diseases of the circulatory system: Secondary | ICD-10-CM | POA: Insufficient documentation

## 2016-08-13 DIAGNOSIS — Z7984 Long term (current) use of oral hypoglycemic drugs: Secondary | ICD-10-CM | POA: Diagnosis not present

## 2016-08-13 DIAGNOSIS — E039 Hypothyroidism, unspecified: Secondary | ICD-10-CM | POA: Diagnosis not present

## 2016-08-13 DIAGNOSIS — E119 Type 2 diabetes mellitus without complications: Secondary | ICD-10-CM | POA: Insufficient documentation

## 2016-08-13 HISTORY — PX: INGUINAL HERNIA REPAIR: SHX194

## 2016-08-13 LAB — GLUCOSE, CAPILLARY
Glucose-Capillary: 131 mg/dL — ABNORMAL HIGH (ref 65–99)
Glucose-Capillary: 120 mg/dL — ABNORMAL HIGH (ref 65–99)

## 2016-08-13 SURGERY — REPAIR, HERNIA, INGUINAL, ADULT
Anesthesia: General | Laterality: Right | Wound class: Clean

## 2016-08-13 MED ORDER — KETOROLAC TROMETHAMINE 30 MG/ML IJ SOLN
INTRAMUSCULAR | Status: AC
  Filled 2016-08-13: qty 1

## 2016-08-13 MED ORDER — BUPIVACAINE HCL (PF) 0.5 % IJ SOLN
INTRAMUSCULAR | Status: AC
  Filled 2016-08-13: qty 30

## 2016-08-13 MED ORDER — PHENYLEPHRINE HCL 10 MG/ML IJ SOLN
INTRAMUSCULAR | Status: DC | PRN
  Administered 2016-08-13: 100 ug via INTRAVENOUS
  Administered 2016-08-13: 200 ug via INTRAVENOUS
  Administered 2016-08-13 (×4): 100 ug via INTRAVENOUS

## 2016-08-13 MED ORDER — FAMOTIDINE 20 MG PO TABS
ORAL_TABLET | ORAL | Status: AC
  Administered 2016-08-13: 20 mg via ORAL
  Filled 2016-08-13: qty 1

## 2016-08-13 MED ORDER — PROPOFOL 10 MG/ML IV BOLUS
INTRAVENOUS | Status: DC | PRN
  Administered 2016-08-13: 100 mg via INTRAVENOUS

## 2016-08-13 MED ORDER — SUGAMMADEX SODIUM 200 MG/2ML IV SOLN
INTRAVENOUS | Status: DC | PRN
  Administered 2016-08-13: 200 mg via INTRAVENOUS

## 2016-08-13 MED ORDER — FENTANYL CITRATE (PF) 100 MCG/2ML IJ SOLN: 25.0000 ug | INTRAMUSCULAR | Status: DC | PRN

## 2016-08-13 MED ORDER — DEXAMETHASONE SODIUM PHOSPHATE 10 MG/ML IJ SOLN
INTRAMUSCULAR | Status: AC
  Filled 2016-08-13: qty 1

## 2016-08-13 MED ORDER — ACETAMINOPHEN 10 MG/ML IV SOLN
INTRAVENOUS | Status: DC | PRN
  Administered 2016-08-13: 1000 mg via INTRAVENOUS

## 2016-08-13 MED ORDER — FENTANYL CITRATE (PF) 100 MCG/2ML IJ SOLN
INTRAMUSCULAR | Status: DC | PRN
  Administered 2016-08-13 (×2): 50 ug via INTRAVENOUS

## 2016-08-13 MED ORDER — PROPOFOL 10 MG/ML IV BOLUS
INTRAVENOUS | Status: AC
  Filled 2016-08-13: qty 20

## 2016-08-13 MED ORDER — FENTANYL CITRATE (PF) 100 MCG/2ML IJ SOLN
INTRAMUSCULAR | Status: AC
  Filled 2016-08-13: qty 2

## 2016-08-13 MED ORDER — LACTATED RINGERS IV SOLN: INTRAVENOUS | Status: DC | PRN

## 2016-08-13 MED ORDER — HYDROCODONE-ACETAMINOPHEN 5-325 MG PO TABS: 1.0000 | ORAL_TABLET | ORAL | 0 refills | Status: DC | PRN

## 2016-08-13 MED ORDER — LIDOCAINE HCL (PF) 2 % IJ SOLN
INTRAMUSCULAR | Status: AC
  Filled 2016-08-13: qty 2

## 2016-08-13 MED ORDER — BUPIVACAINE-EPINEPHRINE (PF) 0.5% -1:200000 IJ SOLN
INTRAMUSCULAR | Status: DC | PRN
  Administered 2016-08-13: 30 mL

## 2016-08-13 MED ORDER — ONDANSETRON HCL 4 MG/2ML IJ SOLN
INTRAMUSCULAR | Status: AC
  Filled 2016-08-13: qty 2

## 2016-08-13 MED ORDER — ONDANSETRON HCL 4 MG/2ML IJ SOLN: 4.0000 mg | Freq: Once | INTRAMUSCULAR | Status: DC | PRN

## 2016-08-13 MED ORDER — EPHEDRINE SULFATE 50 MG/ML IJ SOLN
INTRAMUSCULAR | Status: DC | PRN
  Administered 2016-08-13 (×2): 15 mg via INTRAVENOUS
  Administered 2016-08-13 (×2): 10 mg via INTRAVENOUS

## 2016-08-13 MED ORDER — EPINEPHRINE PF 1 MG/ML IJ SOLN
INTRAMUSCULAR | Status: AC
  Filled 2016-08-13: qty 1

## 2016-08-13 MED ORDER — CEFAZOLIN SODIUM-DEXTROSE 2-4 GM/100ML-% IV SOLN
2.0000 g | Freq: Once | INTRAVENOUS | Status: AC
  Administered 2016-08-13: 2000 mg via INTRAVENOUS

## 2016-08-13 MED ORDER — LIDOCAINE HCL (CARDIAC) 20 MG/ML IV SOLN
INTRAVENOUS | Status: DC | PRN
  Administered 2016-08-13: 60 mg via INTRAVENOUS

## 2016-08-13 MED ORDER — DEXAMETHASONE SODIUM PHOSPHATE 10 MG/ML IJ SOLN
INTRAMUSCULAR | Status: DC | PRN
  Administered 2016-08-13: 8 mg via INTRAVENOUS

## 2016-08-13 MED ORDER — ACETAMINOPHEN 10 MG/ML IV SOLN
INTRAVENOUS | Status: AC
  Filled 2016-08-13: qty 100

## 2016-08-13 MED ORDER — ROCURONIUM BROMIDE 50 MG/5ML IV SOSY
PREFILLED_SYRINGE | INTRAVENOUS | Status: AC
  Filled 2016-08-13: qty 5

## 2016-08-13 MED ORDER — ONDANSETRON HCL 4 MG/2ML IJ SOLN
INTRAMUSCULAR | Status: DC | PRN
  Administered 2016-08-13: 4 mg via INTRAVENOUS

## 2016-08-13 MED ORDER — SUGAMMADEX SODIUM 200 MG/2ML IV SOLN
INTRAVENOUS | Status: AC
  Filled 2016-08-13: qty 2

## 2016-08-13 MED ORDER — CEFAZOLIN SODIUM-DEXTROSE 2-4 GM/100ML-% IV SOLN
INTRAVENOUS | Status: AC
  Administered 2016-08-13: 2000 mg via INTRAVENOUS
  Filled 2016-08-13: qty 100

## 2016-08-13 MED ORDER — EPHEDRINE 5 MG/ML INJ
INTRAVENOUS | Status: AC
  Filled 2016-08-13: qty 10

## 2016-08-13 MED ORDER — ROCURONIUM BROMIDE 100 MG/10ML IV SOLN
INTRAVENOUS | Status: DC | PRN
  Administered 2016-08-13: 50 mg via INTRAVENOUS

## 2016-08-13 SURGICAL SUPPLY — 36 items
BLADE SURG 15 STRL SS SAFETY (BLADE) ×6 IMPLANT
CANISTER SUCT 1200ML W/VALVE (MISCELLANEOUS) ×3 IMPLANT
CHLORAPREP W/TINT 26ML (MISCELLANEOUS) ×3 IMPLANT
CLOSURE WOUND 1/2 X4 (GAUZE/BANDAGES/DRESSINGS) ×1
DECANTER SPIKE VIAL GLASS SM (MISCELLANEOUS) ×3 IMPLANT
DRAIN PENROSE 1/4X12 LTX (DRAIN) ×3 IMPLANT
DRAPE LAPAROTOMY 100X77 ABD (DRAPES) ×3 IMPLANT
DRESSING TELFA 4X3 1S ST N-ADH (GAUZE/BANDAGES/DRESSINGS) ×3 IMPLANT
DRSG TEGADERM 4X4.75 (GAUZE/BANDAGES/DRESSINGS) ×3 IMPLANT
ELECT REM PT RETURN 9FT ADLT (ELECTROSURGICAL) ×3
ELECTRODE REM PT RTRN 9FT ADLT (ELECTROSURGICAL) ×1 IMPLANT
GLOVE BIO SURGEON STRL SZ7.5 (GLOVE) ×7 IMPLANT
GLOVE INDICATOR 8.0 STRL GRN (GLOVE) ×7 IMPLANT
GOWN STRL REUS W/ TWL LRG LVL3 (GOWN DISPOSABLE) ×2 IMPLANT
GOWN STRL REUS W/TWL LRG LVL3 (GOWN DISPOSABLE) ×6
KIT RM TURNOVER STRD PROC AR (KITS) ×3 IMPLANT
LABEL OR SOLS (LABEL) ×3 IMPLANT
MESH SYNTHETIC 1.8X4 KEYHOLE S (Mesh General) IMPLANT
MESH SYNTHETIC KEYHOLE S (Mesh General) ×2 IMPLANT
NDL HYPO 25X1 1.5 SAFETY (NEEDLE) ×1 IMPLANT
NDL SAFETY 22GX1.5 (NEEDLE) ×6 IMPLANT
NEEDLE HYPO 25X1 1.5 SAFETY (NEEDLE) ×3 IMPLANT
PACK BASIN MINOR ARMC (MISCELLANEOUS) ×3 IMPLANT
PENCIL ELECTRO HAND CTR (MISCELLANEOUS) ×2 IMPLANT
STRIP CLOSURE SKIN 1/2X4 (GAUZE/BANDAGES/DRESSINGS) ×2 IMPLANT
SUT SURGILON 0 BLK (SUTURE) ×4 IMPLANT
SUT VIC AB 2-0 SH 27 (SUTURE) ×6
SUT VIC AB 2-0 SH 27XBRD (SUTURE) ×1 IMPLANT
SUT VIC AB 3-0 54X BRD REEL (SUTURE) ×1 IMPLANT
SUT VIC AB 3-0 BRD 54 (SUTURE) ×3
SUT VIC AB 3-0 SH 27 (SUTURE) ×3
SUT VIC AB 3-0 SH 27X BRD (SUTURE) ×1 IMPLANT
SUT VIC AB 4-0 FS2 27 (SUTURE) ×3 IMPLANT
SWABSTK COMLB BENZOIN TINCTURE (MISCELLANEOUS) ×3 IMPLANT
SYR 3ML LL SCALE MARK (SYRINGE) ×3 IMPLANT
SYR CONTROL 10ML (SYRINGE) ×6 IMPLANT

## 2016-08-13 NOTE — Anesthesia Postprocedure Evaluation (Signed)
Anesthesia Post Note  Patient: Brandon Ochoa  Procedure(s) Performed: Procedure(s) (LRB): HERNIA REPAIR INGUINAL ADULT (Right)  Patient location during evaluation: PACU Anesthesia Type: General Level of consciousness: awake Pain management: pain level controlled Vital Signs Assessment: post-procedure vital signs reviewed and stable Respiratory status: spontaneous breathing Cardiovascular status: stable Anesthetic complications: no     Last Vitals:  Vitals:   08/13/16 1344 08/13/16 1354  BP: 129/70 137/76  Pulse: 78 79  Resp: 16 (!) 31  Temp: 36.7 C     Last Pain:  Vitals:   08/13/16 1354  PainSc: 0-No pain                 VAN STAVEREN,Breydon Senters

## 2016-08-13 NOTE — Discharge Instructions (Addendum)

## 2016-08-13 NOTE — Anesthesia Procedure Notes (Signed)
Procedure Name: Intubation Date/Time: 08/13/2016 11:30 AM Performed by: Letitia Neri Pre-anesthesia Checklist: Patient identified, Patient being monitored, Timeout performed, Emergency Drugs available and Suction available Patient Re-evaluated:Patient Re-evaluated prior to inductionOxygen Delivery Method: Circle system utilized Preoxygenation: Pre-oxygenation with 100% oxygen Intubation Type: IV induction Ventilation: Mask ventilation without difficulty Laryngoscope Size: Mac and 3 Grade View: Grade I Tube type: Oral Tube size: 7.0 mm Number of attempts: 1 Airway Equipment and Method: Stylet Placement Confirmation: ETT inserted through vocal cords under direct vision,  positive ETCO2 and breath sounds checked- equal and bilateral Secured at: 23 cm Tube secured with: Tape Dental Injury: Teeth and Oropharynx as per pre-operative assessment

## 2016-08-13 NOTE — Anesthesia Preprocedure Evaluation (Signed)
Anesthesia Evaluation  Patient identified by MRN, date of birth, ID band Patient awake    Reviewed: Allergy & Precautions, NPO status , Patient's Chart, lab work & pertinent test results  Airway Mallampati: II       Dental  (+) Upper Dentures, Lower Dentures   Pulmonary neg pulmonary ROS, former smoker,    breath sounds clear to auscultation       Cardiovascular Exercise Tolerance: Good hypertension, Pt. on home beta blockers + Past MI and + Cardiac Stents   Rhythm:Regular Rate:Normal     Neuro/Psych negative neurological ROS     GI/Hepatic negative GI ROS, Neg liver ROS,   Endo/Other  diabetes, Type 2Hypothyroidism   Renal/GU      Musculoskeletal   Abdominal Normal abdominal exam  (+)   Peds  Hematology   Anesthesia Other Findings   Reproductive/Obstetrics                             Anesthesia Physical Anesthesia Plan  ASA: III  Anesthesia Plan: General   Post-op Pain Management:    Induction: Intravenous  Airway Management Planned: Oral ETT  Additional Equipment:   Intra-op Plan:   Post-operative Plan: Extubation in OR  Informed Consent: I have reviewed the patients History and Physical, chart, labs and discussed the procedure including the risks, benefits and alternatives for the proposed anesthesia with the patient or authorized representative who has indicated his/her understanding and acceptance.     Plan Discussed with: CRNA and Surgeon  Anesthesia Plan Comments:         Anesthesia Quick Evaluation

## 2016-08-13 NOTE — H&P (Signed)
No change in clinical history or exam.  For RIH repair.  

## 2016-08-13 NOTE — Op Note (Signed)
Preoperative diagnosis: Right inguinal hernia.  Postoperative diagnosis: Same.  Operative procedure: Repair of right inguinal hernia with Bard polypropylene mesh area  Operating surgeon: Lane HackerJeffery Byrnett, M.D.  Anesthesia: Gen. endotracheal, Marcaine 0.5% with 1-200,000 epinephrine, 30 mL local infiltration, Toradol 30 mg.  Estimated blood loss: Less than 5 mL.  This 83 year old male presented with the sudden onset of bilateral inguinal hernias over the course of a 2-3 months. He underwent repair of the left contralateral hernia, sliding in nature, approximate 3 weeks ago and did well. He returns now for repair of the right inguinal hernia.  Operative note: Hair was removed from the surgical site with clippers prior to presentation to the operating theater. SCD stockings were used for DVT prevention. The patient received Kefzol prior to the incision.  He'll block anesthesia was placed in the 6 cm incision along the anticipated course inguinal canal was carried down through skin and subcutaneous tissue with hemostasis achieved by electrocautery and 3-0 Vicryl ties. The external oblique was opened in direction of its fibers. The ilioinguinal iliohypogastric and genitofemoral nerves were identified and protected. A very large hernia sac was dissected free down to the level of the testicle. The vas and vessels were protected and the testicle returned to its normal location. The hernia sac was dissected back slowly internal ring. This point and rent was made and the excised sac was discarded. The defect was closed with a running 2-0 Vicryl suture. The floor of the inguinal canal was examined and no direct defect was noted. A Bard polypropylene mesh was smoothed into position and anchored to the pubic tubercle with 0 Surgilon. The inferior aspect of the mesh was anchored to the inguinal ligament. The medial superior borders are anchored to the transverse abdominis aponeurosis. The cord was plastered premade  slit in the lateral aspect closed with interrupted 0 Surgilon sutures. Toradol was placed in the wound. The external oblique was closed with a running 2-0 Vicryl suture. Scarpa's fascia was closed with a running 3-0 Vicryl suture area skin was closed with a running 4-0 Vicryl suture.  The patient tolerated the procedure well and was taken to the recovery room in stable condition.

## 2016-08-13 NOTE — OR Nursing (Signed)
Per Dr. Lemar LivingsByrnett via tele, OK to d/c pt to home without MD visit to postop

## 2016-08-13 NOTE — Transfer of Care (Signed)
Immediate Anesthesia Transfer of Care Note  Patient: Brandon Ochoa L Grizzell  Procedure(s) Performed: Procedure(s): HERNIA REPAIR INGUINAL ADULT (Right)  Patient Location: PACU  Anesthesia Type:General  Level of Consciousness: sedated  Airway & Oxygen Therapy: Patient Spontanous Breathing and Patient connected to face mask oxygen  Post-op Assessment: Report given to RN and Post -op Vital signs reviewed and stable  Post vital signs: Reviewed and stable  Last Vitals:  Vitals:   08/13/16 0954 08/13/16 1259  BP: (!) 130/103 107/69  Pulse:  86  Resp:  (!) 24  Temp:  36.1 C    Last Pain: There were no vitals filed for this visit.       Complications: No apparent anesthesia complications

## 2016-08-13 NOTE — Anesthesia Post-op Follow-up Note (Cosign Needed)
Anesthesia QCDR form completed.        

## 2016-08-14 ENCOUNTER — Ambulatory Visit (INDEPENDENT_AMBULATORY_CARE_PROVIDER_SITE_OTHER): Payer: Medicare HMO | Admitting: General Surgery

## 2016-08-14 ENCOUNTER — Encounter: Payer: Self-pay | Admitting: General Surgery

## 2016-08-14 VITALS — BP 138/72 | HR 84 | Resp 16 | Ht 68.0 in | Wt 178.0 lb

## 2016-08-14 DIAGNOSIS — K4090 Unilateral inguinal hernia, without obstruction or gangrene, not specified as recurrent: Secondary | ICD-10-CM

## 2016-08-14 NOTE — Progress Notes (Signed)
Patient ID: Brandon Ochoa, male   DOB: 1930-02-12, 83 y.o.   MRN: [id number]  Chief Complaint  Patient presents with  . Follow-up    HPI Brandon Ochoa is a 83 y.o. male here today for a evaluation of bleeding for surgery which was done 08/13/2016. He states he has been bleeding off and on since last night around 9.00 pm.  The patient reports she's had very little pain since surgery, and no difficulty with urination. HPI  Past Medical History:  Diagnosis Date  . Coronary artery disease   . Diabetes mellitus without complication (HCC)   . Heart attack   . Hyperlipidemia   . Hypertension   . Hypothyroidism   . Thyroid disease     Past Surgical History:  Procedure Laterality Date  . CARDIAC CATHETERIZATION     X1 stent [place]  . CATARACT EXTRACTION W/ INTRAOCULAR LENS IMPLANT Right   . HERNIA REPAIR    . INGUINAL HERNIA REPAIR Left 07/07/2016   Procedure: HERNIA REPAIR INGUINAL ADULT;  Surgeon: Earline MayotteJeffrey W Byrnett, MD;  Location: [facility] ORS;  Service: General;  Laterality: Left;  . INGUINAL HERNIA REPAIR Right 08/13/2016   Procedure: HERNIA REPAIR INGUINAL ADULT;  Surgeon: Earline MayotteJeffrey W Byrnett, MD;  Location: [facility] ORS;  Service: General;  Laterality: Right;  . STENT PLACEMENT VASCULAR ([facility] HX)      Family History  Problem Relation Age of Onset  . Heart attack Father   . Heart attack Brother   . Heart disease Brother     Social History Social History  Substance Use Topics  . Smoking status: Former Smoker    Years: 15.00  . Smokeless tobacco: Never Used  . Alcohol use No    No Known Allergies  Current Outpatient Prescriptions  Medication Sig Dispense Refill  . aspirin EC 81 MG tablet Take 81 mg by mouth daily.    Marland Kitchen. atorvastatin (LIPITOR) 20 MG tablet Take 1 tablet (20 mg total) by mouth daily at 6 PM. 30 tablet 3  . docusate sodium (COLACE) 100 MG capsule Take 100 mg by mouth 2 (two) times daily.     Marland Kitchen. doxazosin (CARDURA) 2 MG tablet Take 3 mg by mouth every evening.      Marland Kitchen. HYDROcodone-acetaminophen (NORCO) 5-325 MG tablet Take 1-2 tablets by mouth every 4 (four) hours as needed for moderate pain. 30 tablet 0  . latanoprost (XALATAN) 0.005 % ophthalmic solution Place 1 drop into both eyes at bedtime.     . metFORMIN (GLUCOPHAGE) 500 MG tablet Take 500 mg by mouth daily.     . metoprolol succinate (TOPROL XL) 25 MG 24 hr tablet Take 1 tablet (25 mg total) by mouth daily. (Patient taking differently: Take 25 mg by mouth every evening. ) 30 tablet 6   No current facility-administered medications for this visit.     Review of Systems Review of Systems  Blood pressure 138/72, pulse 84, resp. rate 16, height 5\' 8"  (1.727 m), weight 178 lb (80.7 kg).  Physical Exam Physical Exam  Genitourinary:         Assessment    Postoperative bleeding, no evidence of active skin bleeder or deep hematoma.    Plan    Benzoin and Steri-Strips reapplied followed by a mildly bulky gauze and Tegaderm dressing. Ice pack applied.  The patient will continue to use ice intermittently today. He was encouraged to get some more snug underwear to provide better scrotal support.  Follow-up next week as scheduled. The  patient and his family were advised that there may be a small bout of blood, not so much as to saturate the present dressing. If more bleeding occurs he'll call for reassessment.    Return as scheduled.  This information has been scribed by Ples Specter CMA.    Earline Mayotte 08/14/2016, 11:05 AM

## 2016-08-20 ENCOUNTER — Ambulatory Visit (INDEPENDENT_AMBULATORY_CARE_PROVIDER_SITE_OTHER): Payer: Medicare HMO | Admitting: General Surgery

## 2016-08-20 ENCOUNTER — Encounter: Payer: Self-pay | Admitting: General Surgery

## 2016-08-20 VITALS — BP 124/68 | HR 92 | Resp 16 | Ht 71.0 in | Wt 179.0 lb

## 2016-08-20 DIAGNOSIS — K4090 Unilateral inguinal hernia, without obstruction or gangrene, not specified as recurrent: Secondary | ICD-10-CM

## 2016-08-20 NOTE — Progress Notes (Signed)
Patient ID: Brandon LuzCoy L Stenerson, male   DOB: 19-Mar-1930, 83 y.o.   MRN: [id number]  Chief Complaint  Patient presents with  . Routine Post Op    HPI Brandon Ochoa is a 83 y.o. male.  Here today for postoperative visit, he states he is doing well. Denies any gastrointestinal issues, bowels are improving, little constipated taking dulcolax intermittently. He is here with his wife, Carley Hammedva and daughter in law Vernona RiegerLaura.  HPI  Past Medical History:  Diagnosis Date  . Coronary artery disease   . Diabetes mellitus without complication (HCC)   . Heart attack   . Hyperlipidemia   . Hypertension   . Hypothyroidism   . Thyroid disease     Past Surgical History:  Procedure Laterality Date  . CARDIAC CATHETERIZATION     X1 stent [place]  . CATARACT EXTRACTION W/ INTRAOCULAR LENS IMPLANT Right   . HERNIA REPAIR    . INGUINAL HERNIA REPAIR Left 07/07/2016   Procedure: HERNIA REPAIR INGUINAL ADULT;  Surgeon: Earline MayotteJeffrey W Byrnett, MD;  Location: [facility] ORS;  Service: General;  Laterality: Left;  . INGUINAL HERNIA REPAIR Right 08/13/2016   Procedure: HERNIA REPAIR INGUINAL ADULT;  Surgeon: Earline MayotteJeffrey W Byrnett, MD;  Location: [facility] ORS;  Service: General;  Laterality: Right;  . STENT PLACEMENT VASCULAR ([facility] HX)      Family History  Problem Relation Age of Onset  . Heart attack Father   . Heart attack Brother   . Heart disease Brother     Social History Social History  Substance Use Topics  . Smoking status: Former Smoker    Years: 15.00  . Smokeless tobacco: Never Used  . Alcohol use No    No Known Allergies  Current Outpatient Prescriptions  Medication Sig Dispense Refill  . aspirin EC 81 MG tablet Take 81 mg by mouth daily.    Marland Kitchen. atorvastatin (LIPITOR) 20 MG tablet Take 1 tablet (20 mg total) by mouth daily at 6 PM. 30 tablet 3  . bisacodyl (DULCOLAX) 5 MG EC tablet Take 5 mg by mouth daily as needed for moderate constipation.    . docusate sodium (COLACE) 100 MG capsule Take 100 mg by mouth as  needed for mild constipation.     Marland Kitchen. doxazosin (CARDURA) 2 MG tablet Take 3 mg by mouth every evening.     . latanoprost (XALATAN) 0.005 % ophthalmic solution Place 1 drop into both eyes at bedtime.     . metFORMIN (GLUCOPHAGE) 500 MG tablet Take 500 mg by mouth daily.     . metoprolol succinate (TOPROL XL) 25 MG 24 hr tablet Take 1 tablet (25 mg total) by mouth daily. (Patient taking differently: Take 25 mg by mouth every evening. ) 30 tablet 6   No current facility-administered medications for this visit.     Review of Systems Review of Systems  Constitutional: Negative.   Respiratory: Negative.   Cardiovascular: Negative.     Blood pressure 124/68, pulse 92, resp. rate 16, height 5\' 11"  (1.803 m), weight 179 lb (81.2 kg).  Physical Exam Physical Exam  Constitutional: He appears well-developed and well-nourished.  Eyes: No scleral icterus.  Abdominal: Soft. Normal appearance and bowel sounds are normal. There is no tenderness. Hernia confirmed negative in the right inguinal area and confirmed negative in the left inguinal area.  Genitourinary:     Genitourinary Comments: Bruising noted  Lymphadenopathy:    He has no cervical adenopathy.       Assessment  Doing well status post right inguinal hernia repair.    Plan    I had hoped that the patient's constipation would've improved with repair of the left sliding hernia, but this is not the case. The patient has been asked to make use of a stool softener twice a day and Dulcolax as needed.He may be a candidate for a colonoscopy or Cologuard testing.    Stool softener twice a day and dulcolax as needed. Follow up in one month  This information has been scribed by Dorathy Daft RN, BSN,BC.   Earline Mayotte 08/20/2016, 9:10 PM

## 2016-08-20 NOTE — Patient Instructions (Addendum)
The patient is aware to call back for any questions or concerns.    Stool softener twice a day and dulcolax as needed. Follow up in one month

## 2016-09-17 ENCOUNTER — Ambulatory Visit: Payer: Commercial Managed Care - HMO | Admitting: Cardiology

## 2016-09-22 ENCOUNTER — Encounter: Payer: Self-pay | Admitting: General Surgery

## 2016-09-22 ENCOUNTER — Other Ambulatory Visit: Payer: Self-pay

## 2016-09-22 ENCOUNTER — Ambulatory Visit (INDEPENDENT_AMBULATORY_CARE_PROVIDER_SITE_OTHER): Payer: Medicare HMO | Admitting: General Surgery

## 2016-09-22 VITALS — BP 128/64 | HR 66 | Resp 14 | Ht 71.0 in | Wt 180.0 lb

## 2016-09-22 DIAGNOSIS — K4090 Unilateral inguinal hernia, without obstruction or gangrene, not specified as recurrent: Secondary | ICD-10-CM

## 2016-09-22 MED ORDER — ATORVASTATIN CALCIUM 20 MG PO TABS: 20.0000 mg | ORAL_TABLET | Freq: Every day | ORAL | 0 refills | Status: DC

## 2016-09-22 NOTE — Progress Notes (Signed)
Patient ID: Brandon LuzCoy L Latendresse, male   DOB: 1929-10-10, 83 y.o.   MRN: [id number]  Chief Complaint  Patient presents with  . Routine Post Op    right inguinal hernia    HPI Brandon Ochoa is a 83 y.o. male here today for his post op right inguinal hernia repair done on 08/20/2016. Patient states he is doing well. He is here with his wife, Carley Hammedva and daughter in law Vernona RiegerLaura.  HPI  Past Medical History:  Diagnosis Date  . Coronary artery disease   . Diabetes mellitus without complication (HCC)   . Heart attack   . Hyperlipidemia   . Hypertension   . Hypothyroidism   . Thyroid disease     Past Surgical History:  Procedure Laterality Date  . CARDIAC CATHETERIZATION     X1 stent [place]  . CATARACT EXTRACTION W/ INTRAOCULAR LENS IMPLANT Right   . HERNIA REPAIR    . INGUINAL HERNIA REPAIR Left 07/07/2016   Procedure: HERNIA REPAIR INGUINAL ADULT;  Surgeon: Earline MayotteJeffrey W Byrnett, MD;  Location: [facility] ORS;  Service: General;  Laterality: Left;  . INGUINAL HERNIA REPAIR Right 08/13/2016   Procedure: HERNIA REPAIR INGUINAL ADULT;  Surgeon: Earline MayotteJeffrey W Byrnett, MD;  Location: [facility] ORS;  Service: General;  Laterality: Right;  . STENT PLACEMENT VASCULAR ([facility] HX)      Family History  Problem Relation Age of Onset  . Heart attack Father   . Heart attack Brother   . Heart disease Brother     Social History Social History  Substance Use Topics  . Smoking status: Former Smoker    Years: 15.00  . Smokeless tobacco: Never Used  . Alcohol use No    No Known Allergies  Current Outpatient Prescriptions  Medication Sig Dispense Refill  . aspirin EC 81 MG tablet Take 81 mg by mouth daily.    Marland Kitchen. atorvastatin (LIPITOR) 20 MG tablet Take 1 tablet (20 mg total) by mouth daily at 6 PM. 90 tablet 0  . bisacodyl (DULCOLAX) 5 MG EC tablet Take 5 mg by mouth daily as needed for moderate constipation.    . docusate sodium (COLACE) 100 MG capsule Take 100 mg by mouth as needed for mild constipation.     Marland Kitchen.  doxazosin (CARDURA) 2 MG tablet Take 3 mg by mouth every evening.     . latanoprost (XALATAN) 0.005 % ophthalmic solution Place 1 drop into both eyes at bedtime.     . metFORMIN (GLUCOPHAGE) 500 MG tablet Take 500 mg by mouth daily.     . metoprolol succinate (TOPROL XL) 25 MG 24 hr tablet Take 1 tablet (25 mg total) by mouth daily. (Patient taking differently: Take 25 mg by mouth every evening. ) 30 tablet 6   No current facility-administered medications for this visit.     Review of Systems Review of Systems  Constitutional: Negative.   Respiratory: Negative.   Cardiovascular: Negative.     Blood pressure 128/64, pulse 66, resp. rate 14, height 5\' 11"  (1.803 m), weight 180 lb (81.6 kg).  Physical Exam Physical Exam  Constitutional: He is oriented to person, place, and time. He appears well-developed and well-nourished.  Abdominal: No hernia.  Right  inguinal hernia repair intact and healing well.   Genitourinary:     Neurological: He is alert and oriented to person, place, and time.  Skin: Skin is warm and dry.       Assessment    Doing well status post lateral inguinal hernia  repair.  Constipation improved status post repair of left sliding inguinal hernia.    Plan       Patient to return in one month.  This information has been scribed by Ples Specter CMA.   Earline Mayotte 09/23/2016, 2:44 PM

## 2016-09-22 NOTE — Patient Instructions (Addendum)
Patient to return in one month. 

## 2016-10-20 DIAGNOSIS — I2510 Atherosclerotic heart disease of native coronary artery without angina pectoris: Secondary | ICD-10-CM | POA: Diagnosis not present

## 2016-10-20 DIAGNOSIS — R413 Other amnesia: Secondary | ICD-10-CM | POA: Diagnosis not present

## 2016-10-20 DIAGNOSIS — E119 Type 2 diabetes mellitus without complications: Secondary | ICD-10-CM | POA: Diagnosis not present

## 2016-10-20 DIAGNOSIS — Z6827 Body mass index (BMI) 27.0-27.9, adult: Secondary | ICD-10-CM | POA: Diagnosis not present

## 2016-10-20 DIAGNOSIS — H6121 Impacted cerumen, right ear: Secondary | ICD-10-CM | POA: Diagnosis not present

## 2016-10-20 DIAGNOSIS — I10 Essential (primary) hypertension: Secondary | ICD-10-CM | POA: Diagnosis not present

## 2016-10-20 DIAGNOSIS — E663 Overweight: Secondary | ICD-10-CM | POA: Diagnosis not present

## 2016-10-20 DIAGNOSIS — H9319 Tinnitus, unspecified ear: Secondary | ICD-10-CM | POA: Diagnosis not present

## 2016-10-20 DIAGNOSIS — E039 Hypothyroidism, unspecified: Secondary | ICD-10-CM | POA: Diagnosis not present

## 2016-10-21 DIAGNOSIS — E119 Type 2 diabetes mellitus without complications: Secondary | ICD-10-CM | POA: Diagnosis not present

## 2016-10-22 ENCOUNTER — Ambulatory Visit (INDEPENDENT_AMBULATORY_CARE_PROVIDER_SITE_OTHER): Payer: Medicare HMO | Admitting: General Surgery

## 2016-10-22 ENCOUNTER — Encounter: Payer: Self-pay | Admitting: General Surgery

## 2016-10-22 VITALS — BP 154/98 | HR 66 | Resp 14 | Ht 71.0 in | Wt 177.0 lb

## 2016-10-22 DIAGNOSIS — K4090 Unilateral inguinal hernia, without obstruction or gangrene, not specified as recurrent: Secondary | ICD-10-CM

## 2016-10-22 NOTE — Patient Instructions (Signed)
The patient is aware to call back for any questions or concerns.  

## 2016-10-22 NOTE — Progress Notes (Signed)
Patient ID: Brandon Ochoa, male   DOB: 02/25/1930, 83 y.o.   MRN: [id number]  Chief Complaint  Patient presents with  . Routine Post Op    HPI Brandon Ochoa is a 83 y.o. male.  Here today for follow up visit, post right inguinal hernia repair on 08-13-16, he states he is doing well. Denies any gastrointestinal issues, bowels are moving regular with the help of dulcolax. He has recently had a memory test. He is here with his wife, Carley Hammed and daughter in law Vernona Rieger.  HPI  Past Medical History:  Diagnosis Date  . Coronary artery disease   . Diabetes mellitus without complication (HCC)   . Heart attack   . Hyperlipidemia   . Hypertension   . Hypothyroidism   . Thyroid disease     Past Surgical History:  Procedure Laterality Date  . CARDIAC CATHETERIZATION     X1 stent [place]  . CATARACT EXTRACTION W/ INTRAOCULAR LENS IMPLANT Right   . HERNIA REPAIR    . INGUINAL HERNIA REPAIR Left 07/07/2016   Procedure: HERNIA REPAIR INGUINAL ADULT;  Surgeon: Earline Mayotte, MD;  Location: [facility] ORS;  Service: General;  Laterality: Left;  . INGUINAL HERNIA REPAIR Right 08/13/2016   Procedure: HERNIA REPAIR INGUINAL ADULT;  Surgeon: Earline Mayotte, MD;  Location: [facility] ORS;  Service: General;  Laterality: Right;  . STENT PLACEMENT VASCULAR ([facility] HX)      Family History  Problem Relation Age of Onset  . Heart attack Father   . Heart attack Brother   . Heart disease Brother     Social History Social History  Substance Use Topics  . Smoking status: Former Smoker    Years: 15.00  . Smokeless tobacco: Never Used  . Alcohol use No    No Known Allergies  Current Outpatient Prescriptions  Medication Sig Dispense Refill  . aspirin EC 81 MG tablet Take 81 mg by mouth daily.    Marland Kitchen atorvastatin (LIPITOR) 20 MG tablet Take 1 tablet (20 mg total) by mouth daily at 6 PM. 90 tablet 0  . Bisacodyl (DULCOLAX PO) Take by mouth as needed.    . doxazosin (CARDURA) 2 MG tablet Take 3 mg by mouth  every evening.     . latanoprost (XALATAN) 0.005 % ophthalmic solution Place 1 drop into both eyes at bedtime.     . metFORMIN (GLUCOPHAGE) 500 MG tablet Take 500 mg by mouth daily with breakfast.    . LISINOPRIL PO Take by mouth daily.     No current facility-administered medications for this visit.     Review of Systems Review of Systems  Constitutional: Negative.   Respiratory: Negative.   Cardiovascular: Negative.     Blood pressure (!) 154/98, pulse 66, resp. rate 14, height  (1.803 m), weight 177 lb (80.3 kg).  Physical Exam Physical Exam  Constitutional: He is oriented to person, place, and time. He appears well-developed and well-nourished.  Abdominal: Soft. Normal appearance.  Thickening left medial side  Genitourinary:     Neurological: He is alert and oriented to person, place, and time.  Skin: Skin is warm and dry.  Psychiatric: His behavior is normal.      Assessment    Doing well status post bilateral inguinal hernia repair.  Near complete resolution of previous severe constipation.    Plan    Patient will call if any problems develop.    Follow up as needed.  HPI, Physical Exam, Assessment and Plan  have been scribed under the direction and in the presence of Earline Mayotte, MD.  Dorathy Daft, RN  I have completed the exam and reviewed the above documentation for accuracy and completeness.  I agree with the above.  Museum/gallery conservator has been used and any errors in dictation or transcription are unintentional.  Donnalee Curry, M.D., F.A.C.S.  Earline Mayotte 10/22/2016, 9:54 AM

## 2016-12-31 DIAGNOSIS — H9319 Tinnitus, unspecified ear: Secondary | ICD-10-CM | POA: Diagnosis not present

## 2016-12-31 DIAGNOSIS — Z6827 Body mass index (BMI) 27.0-27.9, adult: Secondary | ICD-10-CM | POA: Diagnosis not present

## 2016-12-31 DIAGNOSIS — F0390 Unspecified dementia without behavioral disturbance: Secondary | ICD-10-CM | POA: Diagnosis not present

## 2016-12-31 DIAGNOSIS — I2510 Atherosclerotic heart disease of native coronary artery without angina pectoris: Secondary | ICD-10-CM | POA: Diagnosis not present

## 2017-02-23 DIAGNOSIS — F0390 Unspecified dementia without behavioral disturbance: Secondary | ICD-10-CM | POA: Diagnosis not present

## 2017-02-23 DIAGNOSIS — I10 Essential (primary) hypertension: Secondary | ICD-10-CM | POA: Diagnosis not present

## 2017-02-23 DIAGNOSIS — Z6827 Body mass index (BMI) 27.0-27.9, adult: Secondary | ICD-10-CM | POA: Diagnosis not present

## 2017-02-23 DIAGNOSIS — E039 Hypothyroidism, unspecified: Secondary | ICD-10-CM | POA: Diagnosis not present

## 2017-02-23 DIAGNOSIS — I2510 Atherosclerotic heart disease of native coronary artery without angina pectoris: Secondary | ICD-10-CM | POA: Diagnosis not present

## 2017-02-23 DIAGNOSIS — E119 Type 2 diabetes mellitus without complications: Secondary | ICD-10-CM | POA: Diagnosis not present

## 2017-04-26 DIAGNOSIS — I2510 Atherosclerotic heart disease of native coronary artery without angina pectoris: Secondary | ICD-10-CM | POA: Diagnosis not present

## 2017-04-26 DIAGNOSIS — E119 Type 2 diabetes mellitus without complications: Secondary | ICD-10-CM | POA: Diagnosis not present

## 2017-04-26 DIAGNOSIS — Z9181 History of falling: Secondary | ICD-10-CM | POA: Diagnosis not present

## 2017-04-26 DIAGNOSIS — Z23 Encounter for immunization: Secondary | ICD-10-CM | POA: Diagnosis not present

## 2017-04-26 DIAGNOSIS — E039 Hypothyroidism, unspecified: Secondary | ICD-10-CM | POA: Diagnosis not present

## 2017-04-26 DIAGNOSIS — Z6826 Body mass index (BMI) 26.0-26.9, adult: Secondary | ICD-10-CM | POA: Diagnosis not present

## 2017-04-26 DIAGNOSIS — I10 Essential (primary) hypertension: Secondary | ICD-10-CM | POA: Diagnosis not present

## 2017-04-26 DIAGNOSIS — F0390 Unspecified dementia without behavioral disturbance: Secondary | ICD-10-CM | POA: Diagnosis not present

## 2017-05-20 DIAGNOSIS — R410 Disorientation, unspecified: Secondary | ICD-10-CM | POA: Diagnosis not present

## 2017-05-20 DIAGNOSIS — F0390 Unspecified dementia without behavioral disturbance: Secondary | ICD-10-CM | POA: Diagnosis not present

## 2017-05-20 DIAGNOSIS — R531 Weakness: Secondary | ICD-10-CM | POA: Diagnosis not present

## 2017-06-03 DIAGNOSIS — I10 Essential (primary) hypertension: Secondary | ICD-10-CM | POA: Diagnosis not present

## 2017-06-03 DIAGNOSIS — E039 Hypothyroidism, unspecified: Secondary | ICD-10-CM | POA: Diagnosis not present

## 2017-06-03 DIAGNOSIS — R42 Dizziness and giddiness: Secondary | ICD-10-CM | POA: Diagnosis not present

## 2017-06-03 DIAGNOSIS — R410 Disorientation, unspecified: Secondary | ICD-10-CM | POA: Diagnosis not present

## 2017-06-03 DIAGNOSIS — F0390 Unspecified dementia without behavioral disturbance: Secondary | ICD-10-CM | POA: Diagnosis not present

## 2017-07-05 DIAGNOSIS — E039 Hypothyroidism, unspecified: Secondary | ICD-10-CM | POA: Diagnosis not present

## 2017-07-05 DIAGNOSIS — F0390 Unspecified dementia without behavioral disturbance: Secondary | ICD-10-CM | POA: Diagnosis not present

## 2017-07-05 DIAGNOSIS — I10 Essential (primary) hypertension: Secondary | ICD-10-CM | POA: Diagnosis not present

## 2017-09-09 DIAGNOSIS — Z6825 Body mass index (BMI) 25.0-25.9, adult: Secondary | ICD-10-CM | POA: Diagnosis not present

## 2017-09-09 DIAGNOSIS — F329 Major depressive disorder, single episode, unspecified: Secondary | ICD-10-CM | POA: Diagnosis not present

## 2017-09-09 DIAGNOSIS — F0390 Unspecified dementia without behavioral disturbance: Secondary | ICD-10-CM | POA: Diagnosis not present

## 2017-09-09 DIAGNOSIS — I10 Essential (primary) hypertension: Secondary | ICD-10-CM | POA: Diagnosis not present

## 2017-09-09 DIAGNOSIS — E039 Hypothyroidism, unspecified: Secondary | ICD-10-CM | POA: Diagnosis not present

## 2017-09-09 DIAGNOSIS — Z139 Encounter for screening, unspecified: Secondary | ICD-10-CM | POA: Diagnosis not present

## 2017-09-09 DIAGNOSIS — Z1331 Encounter for screening for depression: Secondary | ICD-10-CM | POA: Diagnosis not present

## 2017-09-09 DIAGNOSIS — E119 Type 2 diabetes mellitus without complications: Secondary | ICD-10-CM | POA: Diagnosis not present

## 2017-10-11 DIAGNOSIS — F0390 Unspecified dementia without behavioral disturbance: Secondary | ICD-10-CM | POA: Diagnosis not present

## 2017-10-11 DIAGNOSIS — F329 Major depressive disorder, single episode, unspecified: Secondary | ICD-10-CM | POA: Diagnosis not present

## 2017-11-09 DIAGNOSIS — F329 Major depressive disorder, single episode, unspecified: Secondary | ICD-10-CM | POA: Diagnosis not present

## 2017-11-09 DIAGNOSIS — Z6823 Body mass index (BMI) 23.0-23.9, adult: Secondary | ICD-10-CM | POA: Diagnosis not present

## 2017-11-09 DIAGNOSIS — I10 Essential (primary) hypertension: Secondary | ICD-10-CM | POA: Diagnosis not present

## 2017-11-09 DIAGNOSIS — E039 Hypothyroidism, unspecified: Secondary | ICD-10-CM | POA: Diagnosis not present

## 2017-11-09 DIAGNOSIS — F0390 Unspecified dementia without behavioral disturbance: Secondary | ICD-10-CM | POA: Diagnosis not present

## 2017-11-09 DIAGNOSIS — Z7189 Other specified counseling: Secondary | ICD-10-CM | POA: Diagnosis not present

## 2017-11-09 DIAGNOSIS — E119 Type 2 diabetes mellitus without complications: Secondary | ICD-10-CM | POA: Diagnosis not present

## 2018-02-09 DIAGNOSIS — F0390 Unspecified dementia without behavioral disturbance: Secondary | ICD-10-CM | POA: Diagnosis not present

## 2018-02-09 DIAGNOSIS — Z6825 Body mass index (BMI) 25.0-25.9, adult: Secondary | ICD-10-CM | POA: Diagnosis not present

## 2018-02-09 DIAGNOSIS — I10 Essential (primary) hypertension: Secondary | ICD-10-CM | POA: Diagnosis not present

## 2018-02-09 DIAGNOSIS — F329 Major depressive disorder, single episode, unspecified: Secondary | ICD-10-CM | POA: Diagnosis not present

## 2018-02-09 DIAGNOSIS — H6120 Impacted cerumen, unspecified ear: Secondary | ICD-10-CM | POA: Diagnosis not present

## 2018-02-09 DIAGNOSIS — Z1339 Encounter for screening examination for other mental health and behavioral disorders: Secondary | ICD-10-CM | POA: Diagnosis not present

## 2018-02-09 DIAGNOSIS — E039 Hypothyroidism, unspecified: Secondary | ICD-10-CM | POA: Diagnosis not present

## 2018-02-09 DIAGNOSIS — E119 Type 2 diabetes mellitus without complications: Secondary | ICD-10-CM | POA: Diagnosis not present

## 2018-04-20 DIAGNOSIS — H1013 Acute atopic conjunctivitis, bilateral: Secondary | ICD-10-CM | POA: Diagnosis not present

## 2018-04-20 DIAGNOSIS — R451 Restlessness and agitation: Secondary | ICD-10-CM | POA: Diagnosis not present

## 2018-04-20 DIAGNOSIS — F0390 Unspecified dementia without behavioral disturbance: Secondary | ICD-10-CM | POA: Diagnosis not present

## 2018-04-20 DIAGNOSIS — Z6825 Body mass index (BMI) 25.0-25.9, adult: Secondary | ICD-10-CM | POA: Diagnosis not present

## 2018-04-21 DIAGNOSIS — E119 Type 2 diabetes mellitus without complications: Secondary | ICD-10-CM | POA: Diagnosis not present

## 2018-04-21 DIAGNOSIS — I10 Essential (primary) hypertension: Secondary | ICD-10-CM | POA: Diagnosis not present

## 2018-04-21 DIAGNOSIS — F0390 Unspecified dementia without behavioral disturbance: Secondary | ICD-10-CM | POA: Diagnosis not present

## 2018-04-21 DIAGNOSIS — M1990 Unspecified osteoarthritis, unspecified site: Secondary | ICD-10-CM | POA: Diagnosis not present

## 2018-04-21 DIAGNOSIS — I2510 Atherosclerotic heart disease of native coronary artery without angina pectoris: Secondary | ICD-10-CM | POA: Diagnosis not present

## 2018-04-21 DIAGNOSIS — Z87891 Personal history of nicotine dependence: Secondary | ICD-10-CM | POA: Diagnosis not present

## 2018-04-21 DIAGNOSIS — N400 Enlarged prostate without lower urinary tract symptoms: Secondary | ICD-10-CM | POA: Diagnosis not present

## 2018-04-21 DIAGNOSIS — F419 Anxiety disorder, unspecified: Secondary | ICD-10-CM | POA: Diagnosis not present

## 2018-04-21 DIAGNOSIS — F329 Major depressive disorder, single episode, unspecified: Secondary | ICD-10-CM | POA: Diagnosis not present

## 2018-04-21 DIAGNOSIS — Z7982 Long term (current) use of aspirin: Secondary | ICD-10-CM | POA: Diagnosis not present

## 2018-04-26 DIAGNOSIS — I10 Essential (primary) hypertension: Secondary | ICD-10-CM | POA: Diagnosis not present

## 2018-04-26 DIAGNOSIS — E039 Hypothyroidism, unspecified: Secondary | ICD-10-CM | POA: Diagnosis not present

## 2018-04-26 DIAGNOSIS — F0390 Unspecified dementia without behavioral disturbance: Secondary | ICD-10-CM | POA: Diagnosis not present

## 2018-04-26 DIAGNOSIS — Z6825 Body mass index (BMI) 25.0-25.9, adult: Secondary | ICD-10-CM | POA: Diagnosis not present

## 2018-04-26 DIAGNOSIS — R451 Restlessness and agitation: Secondary | ICD-10-CM | POA: Diagnosis not present

## 2018-05-05 ENCOUNTER — Other Ambulatory Visit: Payer: Self-pay

## 2018-05-05 ENCOUNTER — Emergency Department
Admission: EM | Admit: 2018-05-05 | Discharge: 2018-05-06 | Disposition: A | Discharge status: Home / Self Care | Payer: PPO | Attending: Emergency Medicine | Admitting: Emergency Medicine

## 2018-05-05 ENCOUNTER — Encounter: Payer: Self-pay | Admitting: Emergency Medicine

## 2018-05-05 ENCOUNTER — Emergency Department: Payer: PPO

## 2018-05-05 DIAGNOSIS — S0990XA Unspecified injury of head, initial encounter: Secondary | ICD-10-CM | POA: Diagnosis not present

## 2018-05-05 DIAGNOSIS — Z043 Encounter for examination and observation following other accident: Secondary | ICD-10-CM | POA: Insufficient documentation

## 2018-05-05 DIAGNOSIS — E785 Hyperlipidemia, unspecified: Secondary | ICD-10-CM | POA: Insufficient documentation

## 2018-05-05 DIAGNOSIS — R102 Pelvic and perineal pain: Secondary | ICD-10-CM | POA: Diagnosis not present

## 2018-05-05 DIAGNOSIS — Z79899 Other long term (current) drug therapy: Secondary | ICD-10-CM | POA: Diagnosis not present

## 2018-05-05 DIAGNOSIS — Z7982 Long term (current) use of aspirin: Secondary | ICD-10-CM | POA: Diagnosis not present

## 2018-05-05 DIAGNOSIS — S3993XA Unspecified injury of pelvis, initial encounter: Secondary | ICD-10-CM | POA: Diagnosis not present

## 2018-05-05 DIAGNOSIS — R531 Weakness: Secondary | ICD-10-CM | POA: Diagnosis present

## 2018-05-05 DIAGNOSIS — Z7984 Long term (current) use of oral hypoglycemic drugs: Secondary | ICD-10-CM | POA: Diagnosis not present

## 2018-05-05 DIAGNOSIS — I10 Essential (primary) hypertension: Secondary | ICD-10-CM | POA: Diagnosis not present

## 2018-05-05 DIAGNOSIS — E039 Hypothyroidism, unspecified: Secondary | ICD-10-CM | POA: Insufficient documentation

## 2018-05-05 DIAGNOSIS — I2510 Atherosclerotic heart disease of native coronary artery without angina pectoris: Secondary | ICD-10-CM | POA: Insufficient documentation

## 2018-05-05 DIAGNOSIS — Z87891 Personal history of nicotine dependence: Secondary | ICD-10-CM | POA: Diagnosis not present

## 2018-05-05 DIAGNOSIS — E119 Type 2 diabetes mellitus without complications: Secondary | ICD-10-CM | POA: Diagnosis not present

## 2018-05-05 DIAGNOSIS — R262 Difficulty in walking, not elsewhere classified: Secondary | ICD-10-CM | POA: Diagnosis not present

## 2018-05-05 DIAGNOSIS — F0390 Unspecified dementia without behavioral disturbance: Secondary | ICD-10-CM | POA: Diagnosis not present

## 2018-05-05 DIAGNOSIS — F0391 Unspecified dementia with behavioral disturbance: Secondary | ICD-10-CM

## 2018-05-05 LAB — BASIC METABOLIC PANEL
ANION GAP: 8 (ref 5–15)
BUN: 23 mg/dL (ref 8–23)
CALCIUM: 8.7 mg/dL — AB (ref 8.9–10.3)
CO2: 25 mmol/L (ref 22–32)
CREATININE: 1.2 mg/dL (ref 0.61–1.24)
Chloride: 109 mmol/L (ref 98–111)
GFR calc Af Amer: >60 mL/min (ref >60)
GFR, EST NON AFRICAN AMERICAN: 53 mL/min — AB (ref >60)
GLUCOSE: 106 mg/dL — AB (ref 70–99)
Potassium: 4.8 mmol/L (ref 3.5–5.1)
Sodium: 142 mmol/L (ref 135–145)

## 2018-05-05 LAB — URINALYSIS, COMPLETE (UACMP) WITH MICROSCOPIC
Bacteria, UA: NONE SEEN
Bilirubin Urine: NEGATIVE
GLUCOSE, UA: NEGATIVE mg/dL
Hgb urine dipstick: NEGATIVE
KETONES UR: 5 mg/dL — AB
Nitrite: NEGATIVE
PH: 5 (ref 5.0–8.0)
Protein, ur: NEGATIVE mg/dL
SPECIFIC GRAVITY, URINE: 1.023 (ref 1.005–1.030)

## 2018-05-05 LAB — LACTIC ACID, PLASMA: Lactic Acid, Venous: 1.9 mmol/L (ref 0.5–1.9)

## 2018-05-05 LAB — CBC
HCT: 46.9 % (ref 39.0–52.0)
Hemoglobin: 15.6 g/dL (ref 13.0–17.0)
MCH: 29.9 pg (ref 26.0–34.0)
MCHC: 33.3 g/dL (ref 30.0–36.0)
MCV: 89.8 fL (ref 80.0–100.0)
PLATELETS: 115 10*3/uL — AB (ref 150–400)
RBC: 5.22 MIL/uL (ref 4.22–5.81)
RDW: 13.4 % (ref 11.5–15.5)
WBC: 14.1 10*3/uL — ABNORMAL HIGH (ref 4.0–10.5)
nRBC: 0 % (ref 0.0–0.2)

## 2018-05-05 LAB — TROPONIN I: Troponin I: 0.03 ng/mL (ref <0.03)

## 2018-05-05 MED ORDER — TRAZODONE HCL 50 MG PO TABS
150.0000 mg | ORAL_TABLET | Freq: Once | ORAL | Status: AC
  Administered 2018-05-05: 150 mg via ORAL
  Filled 2018-05-05: qty 1

## 2018-05-05 MED ORDER — ATORVASTATIN CALCIUM 20 MG PO TABS: 20.0000 mg | ORAL_TABLET | Freq: Every day | ORAL | Status: DC

## 2018-05-05 MED ORDER — DOXAZOSIN MESYLATE 2 MG PO TABS
3.0000 mg | ORAL_TABLET | Freq: Every evening | ORAL | Status: DC
  Administered 2018-05-05: 3 mg via ORAL
  Filled 2018-05-05 (×2): qty 1

## 2018-05-05 MED ORDER — SODIUM CHLORIDE 0.9 % IV BOLUS
1000.0000 mL | Freq: Once | INTRAVENOUS | Status: AC
  Administered 2018-05-05: 1000 mL via INTRAVENOUS

## 2018-05-05 MED ORDER — METFORMIN HCL 500 MG PO TABS
500.0000 mg | ORAL_TABLET | Freq: Every day | ORAL | Status: DC
  Filled 2018-05-05 (×2): qty 1

## 2018-05-05 MED ORDER — ASPIRIN EC 81 MG PO TBEC
81.0000 mg | DELAYED_RELEASE_TABLET | Freq: Every day | ORAL | Status: DC
  Administered 2018-05-05: 81 mg via ORAL
  Filled 2018-05-05 (×2): qty 1

## 2018-05-05 NOTE — ED Notes (Signed)
Resumed care from christina rn.   Iv fluids infusing.  Pt awake.

## 2018-05-05 NOTE — ED Triage Notes (Signed)
Pt from home - son called ems for increased weakness and fall. Per son pt c/o hip no c/o pain with ems or here. Pt covered in ants. Hx dementia and htn

## 2018-05-05 NOTE — ED Notes (Addendum)
Abrasion to left elbow, redness to left hip and left leg

## 2018-05-05 NOTE — ED Notes (Signed)
Patient transported to X-ray 

## 2018-05-05 NOTE — Clinical Social Work Note (Addendum)
CSW received consult that patient may need SNF placement pending PT recommendations.  Patient has [employer], and will need insurance approval before patient is able to go to SNF if she is agreeable.  Ervin Knack. Hassan Rowan, MSW, Theresia Majors [phone] 05/05/2018 4:35 PM

## 2018-05-05 NOTE — ED Provider Notes (Addendum)
[facility] Emergency Department Provider Note  Time seen: 11:21 AM  I have reviewed the triage vital signs and the nursing notes.   HISTORY  Chief Complaint Weakness    HPI Brandon Ochoa is a 84 y.o. male with a past medical history of CAD, diabetes, hypertension, hyperlipidemia, dementia, presents to the emergency department for increased weakness and a fall.  Per EMS report the son states the patient was complaining of hip pain.  Here the patient denies any pain.  Triage nurse did note that the patient had ants on him.  Here the patient is calm, cooperative, pleasant he has dementia so he cannot contribute much to his history or review of systems however denies any complaints of pain at this time.   Past Medical History:  Diagnosis Date  . Coronary artery disease   . Diabetes mellitus without complication (HCC)   . Heart attack (HCC)   . Hyperlipidemia   . Hypertension   . Hypothyroidism   . Thyroid disease     Patient Active Problem List   Diagnosis Date Noted  . Urinary incontinence 07/29/2016  . Constipation 06/25/2016  . Encounter for screening colonoscopy 06/25/2016    Past Surgical History:  Procedure Laterality Date  . CARDIAC CATHETERIZATION     X1 stent [place]  . CATARACT EXTRACTION W/ INTRAOCULAR LENS IMPLANT Right   . HERNIA REPAIR    . INGUINAL HERNIA REPAIR Left 07/07/2016   Procedure: HERNIA REPAIR INGUINAL ADULT;  Surgeon: Earline Mayotte, MD;  Location: [facility] ORS;  Service: General;  Laterality: Left;  . INGUINAL HERNIA REPAIR Right 08/13/2016   Procedure: HERNIA REPAIR INGUINAL ADULT;  Surgeon: Earline Mayotte, MD;  Location: [facility] ORS;  Service: General;  Laterality: Right;  . STENT PLACEMENT VASCULAR ([facility] HX)      Prior to Admission medications   Medication Sig Start Date End Date Taking? Authorizing Provider  aspirin EC 81 MG tablet Take 81 mg by mouth daily.    [provider]  atorvastatin (LIPITOR) 20 MG  tablet Take 1 tablet (20 mg total) by mouth daily at 6 PM. 09/22/16 12/21/16  Almond Lint, MD  Bisacodyl (DULCOLAX PO) Take by mouth as needed.    [provider]  doxazosin (CARDURA) 2 MG tablet Take 3 mg by mouth every evening.  05/25/16   [provider]  latanoprost (XALATAN) 0.005 % ophthalmic solution Place 1 drop into both eyes at bedtime.  06/15/16   [provider]  LISINOPRIL PO Take by mouth daily.    [provider]  metFORMIN (GLUCOPHAGE) 500 MG tablet Take 500 mg by mouth daily with breakfast.    [provider]    No Known Allergies  Family History  Problem Relation Age of Onset  . Heart attack Father   . Heart attack Brother   . Heart disease Brother     Social History Social History   Tobacco Use  . Smoking status: Former Smoker    Years: 15.00  . Smokeless tobacco: Never Used  Substance Use Topics  . Alcohol use: No  . Drug use: No    Review of Systems Constitutional: Negative for fever.  Active LOC. Cardiovascular: Negative for chest pain. Respiratory: Negative for shortness of breath. Gastrointestinal: Negative for abdominal pain, vomiting  Genitourinary: Negative for urinary compaints Musculoskeletal: Negative for musculoskeletal complaints Skin: Negative for skin complaints  Neurological: Negative for headache All other ROS negative, although possibly limited by dementia.  ____________________________________________  PHYSICAL EXAM:  Constitutional: Alert, well-appearing, no distress, calm and cooperative without any acute complaints. Eyes: Normal exam ENT   Head: Normocephalic and atraumatic.   Nose: No congestion/rhinnorhea.   Mouth/Throat: Mucous membranes are moist. Cardiovascular: Normal rate, regular rhythm. No murmur Respiratory: Normal respiratory effort without tachypnea nor retractions. Breath sounds are clear  Gastrointestinal: Soft and nontender. No distention.    Musculoskeletal: Nontender with normal range of motion in all extremities. No lower extremity tenderness or edema.  Good range of motion all extremities including bilateral lower external and said any pain or tenderness elicited.  Skin tear to left elbow. Neurologic:  Normal speech and language. No gross focal neurologic deficits Skin:  Skin is warm, dry and intact.  Psychiatric: Mood and affect are normal.   ____________________________________________    EKG  EKG reviewed and interpreted by myself shows a normal sinus rhythm at 97 bpm with a narrow QRS, normal axis, slight QTC prolongation, nonspecific ST changes.  ____________________________________________    RADIOLOGY  CT scan head is negative. X-ray the pelvis is negative.  ____________________________________________   INITIAL IMPRESSION / ASSESSMENT AND PLAN / ED COURSE  Pertinent labs & imaging results that were available during my care of the patient were reviewed by me and considered in my medical decision making (see chart for details).  Patient presents to the emergency department for weakness and a fall per son.  Here the patient denies any symptoms denies any pain complaints, denies any weakness.  Patient has a history of dementia and history is questionable.  Will obtain labs, CT scan of the head and a pelvis x-ray as a precaution.  Imaging is negative.  Labs are resulted largely within normal limits/baseline for the patient.  No acute abnormality.  Urinalysis have resulted showing a slight amount of leukocytes, urine culture has been added.  Family is now here with the patient is safe for the past 1 year the patient has had worsening dementia symptoms to the point where he is falling at home and there are no longer able to safely care for the patient at home.  This appears to be a progressive decline opposed to an acute deterioration.  With a largely normal medical work-up we will discuss with social work and PT to  see if there is anything that can be done for the patient as far as home health needs replacement.  Family agreeable to plan of care.  Son states they could not safely take home.  Patient has dementia, continues to try to get out of the bed.  They will dose their home mirtazapine.  I wrote the patient for trazodone and reordered his home medications.  We will board in the emergency department tonight until social work and physical therapy can see tomorrow morning.  Family agreeable to plan of care.  ____________________________________________   FINAL CLINICAL IMPRESSION(S) / ED DIAGNOSES  Weakness Fall Dementia   Minna Antis, MD 05/05/18 1539    Minna Antis, MD 05/05/18 2012

## 2018-05-05 NOTE — ED Notes (Signed)
Date and time results received: 05/05/18 1510 (use smartphrase ".now" to insert current time)  Test: Troponin Critical Value: 0.03  Name of Provider Notified: Paduchowski  Orders Received? Or Actions Taken?: No orders received

## 2018-05-05 NOTE — ED Notes (Signed)
Notified MD Schaevitz pts BP 70/47. Received orders to administer NS

## 2018-05-05 NOTE — ED Notes (Signed)
Patient transported to CT 

## 2018-05-05 NOTE — ED Notes (Addendum)
Pts brief changed  

## 2018-05-06 ENCOUNTER — Other Ambulatory Visit: Payer: Self-pay

## 2018-05-06 DIAGNOSIS — R296 Repeated falls: Secondary | ICD-10-CM | POA: Diagnosis not present

## 2018-05-06 DIAGNOSIS — M25552 Pain in left hip: Secondary | ICD-10-CM | POA: Diagnosis not present

## 2018-05-06 DIAGNOSIS — E039 Hypothyroidism, unspecified: Secondary | ICD-10-CM | POA: Diagnosis not present

## 2018-05-06 DIAGNOSIS — Z7401 Bed confinement status: Secondary | ICD-10-CM | POA: Diagnosis not present

## 2018-05-06 DIAGNOSIS — E7849 Other hyperlipidemia: Secondary | ICD-10-CM | POA: Diagnosis not present

## 2018-05-06 DIAGNOSIS — K5900 Constipation, unspecified: Secondary | ICD-10-CM | POA: Diagnosis not present

## 2018-05-06 DIAGNOSIS — M6281 Muscle weakness (generalized): Secondary | ICD-10-CM | POA: Diagnosis not present

## 2018-05-06 DIAGNOSIS — F0391 Unspecified dementia with behavioral disturbance: Secondary | ICD-10-CM | POA: Diagnosis not present

## 2018-05-06 DIAGNOSIS — R32 Unspecified urinary incontinence: Secondary | ICD-10-CM | POA: Diagnosis not present

## 2018-05-06 DIAGNOSIS — F0390 Unspecified dementia without behavioral disturbance: Secondary | ICD-10-CM | POA: Diagnosis not present

## 2018-05-06 DIAGNOSIS — R1312 Dysphagia, oropharyngeal phase: Secondary | ICD-10-CM | POA: Diagnosis not present

## 2018-05-06 DIAGNOSIS — Z9181 History of falling: Secondary | ICD-10-CM | POA: Diagnosis not present

## 2018-05-06 DIAGNOSIS — E119 Type 2 diabetes mellitus without complications: Secondary | ICD-10-CM | POA: Diagnosis not present

## 2018-05-06 DIAGNOSIS — R1310 Dysphagia, unspecified: Secondary | ICD-10-CM | POA: Diagnosis not present

## 2018-05-06 DIAGNOSIS — I2510 Atherosclerotic heart disease of native coronary artery without angina pectoris: Secondary | ICD-10-CM | POA: Diagnosis not present

## 2018-05-06 DIAGNOSIS — E038 Other specified hypothyroidism: Secondary | ICD-10-CM | POA: Diagnosis not present

## 2018-05-06 DIAGNOSIS — R5381 Other malaise: Secondary | ICD-10-CM | POA: Diagnosis not present

## 2018-05-06 DIAGNOSIS — I10 Essential (primary) hypertension: Secondary | ICD-10-CM | POA: Diagnosis not present

## 2018-05-06 LAB — TROPONIN I: TROPONIN I: 0.04 ng/mL — AB (ref <0.03)

## 2018-05-06 LAB — GLUCOSE, CAPILLARY: GLUCOSE-CAPILLARY: 90 mg/dL (ref 70–99)

## 2018-05-06 MED ORDER — LORAZEPAM 1 MG PO TABS: 1.0000 mg | ORAL_TABLET | Freq: Once | ORAL | Status: DC

## 2018-05-06 MED ORDER — TRAZODONE HCL 150 MG PO TABS: 150.0000 mg | ORAL_TABLET | Freq: Every day | ORAL | 0 refills | Status: AC

## 2018-05-06 MED ORDER — ENSURE ENLIVE PO LIQD
237.0000 mL | Freq: Two times a day (BID) | ORAL | Status: DC
  Administered 2018-05-06: 237 mL via ORAL

## 2018-05-06 MED ORDER — LORAZEPAM 2 MG/ML IJ SOLN
INTRAMUSCULAR | Status: AC
  Administered 2018-05-06: 1 mg
  Filled 2018-05-06: qty 1

## 2018-05-06 NOTE — ED Notes (Signed)
Report off to john rn.   

## 2018-05-06 NOTE — Clinical Social Work Note (Signed)
Clinical Social Work Assessment  Patient Details  Name: Brandon Ochoa MRN: [id number] Date of Birth: Nov 12, 1929  Date of referral:  05/06/18               Reason for consult:  Facility Placement                Permission sought to share information with:  Facility Medical sales representative, Family Supports Permission granted to share information::  Yes, Verbal Permission Granted  Name::     Takahiro Godinho ( spouse) [phone]  Agency::  All facilities  Relationship::     Contact Information:     Housing/Transportation Living arrangements for the past 2 months:  Mobile Home Source of Information:  Spouse, Adult Children Patient Interpreter Needed:  None Criminal Activity/Legal Involvement Pertinent to Current Situation/Hospitalization:  No - Comment as needed Significant Relationships:  Adult Children, Church, Spouse Lives with:  Spouse Do you feel safe going back to the place where you live?  Yes Need for family participation in patient care:  Yes (Comment)  Care giving concerns:  They are concerned with increased behaviors    Social Worker assessment / plan:  LCSW introduced myself to family and have discussed we are awaiting PT consult. They report patient has dementia and his behaviors have increased. LCSW discussed PT consult is pending. Patient has not been able to walk since yesterday. Patient is married and has 3 sons and spouse is patient but scared at times as he has been a little agitated lately. Their sons visit every day. Patient needs full assistance with his ADls and feeds himself with his fingers, forgets utensil use. Patient is HOH- or family reports he will refuse to listen. He has poor eye sight and ringing in his ears. As last night patient is unable to walk now and appears weak. LCSW competed assessment and fl2 and PT report supports STR in SNF.    Employment status:  Retired Health and safety inspector:  Other (Comment Required)(Health Doctor, hospital) PT Recommendations:   Not assessed at this time, Skilled Nursing Facility(Awaiting PT consult) Information / Referral to community resources:  Skilled Nursing Facility  Patient/Family's Response to care:  Need STR  Patient/Family's Understanding of and Emotional Response to Diagnosis, Current Treatment, and Prognosis:  Patient has no understanding but family feels he would benefit from STR  Emotional Assessment Appearance:  Appears stated age Attitude/Demeanor/Rapport:  Unable to Assess(Dementia) Affect (typically observed):  Unable to Assess Orientation:  Oriented to Self Alcohol / Substance use:  Not Applicable Psych involvement (Current and /or in the community):  No (Comment)  Discharge Needs  Concerns to be addressed:  Care Coordination Readmission within the last 30 days:  No Current discharge risk:  Cognitively Impaired Barriers to Discharge:  Insurance Authorization   Cheron Schaumann, [place] 05/06/2018, 10:50 AM

## 2018-05-06 NOTE — Progress Notes (Signed)
Realyed info to ED secretary and EDRN and EDP  Spoke to McNair at [facility] and she has provided room number 42 and patient will transport by EMS  [facility] and Berkley Harvey number is [phone]  Call report number to [facility]  is [phone]  Family has directions on location.  [employer] LCSW [phone]

## 2018-05-06 NOTE — ED Notes (Signed)
Dr.Stafford notified of patient's elevated troponin, no new orders at this time.  Claudine with SW notified family is at bedside as well.

## 2018-05-06 NOTE — ED Notes (Signed)
PT in room working with patient, family at bedside.

## 2018-05-06 NOTE — ED Notes (Signed)
Called [facility] for transport to [facility]

## 2018-05-06 NOTE — Progress Notes (Signed)
LCSW will provide patients family with googles map to [facility] with EDRN input.  LCSW explained we are still awaiting the authorization number but as patient has several complex needs ( DEMENTIA) He will need to be reviewed by medical team.  Family understands he may not transfer until tommorow  Latangela Mccomas [place] LCSW [phone]

## 2018-05-06 NOTE — [place] FL2 (Addendum)
[place] MEDICAID FL2 LEVEL OF CARE SCREENING TOOL     IDENTIFICATION  Patient Name: Brandon Ochoa Birthdate: 1930-04-01 Sex: male Admission Date (Current Location): 05/05/2018  [place] and [place] Number:  Chiropodist and Address:  [facility], [address] [place], [place] [address]      Provider Number: [id number]  Attending Physician Name and Address:  No att. providers found  Relative Name and Phone Number:   Jaleil Renwick [phone]    Current Level of Care: Hospital Recommended Level of Care: Skilled Nursing Facility Prior Approval Number:    Date Approved/Denied:   PASRR Number:    [id number]  Discharge Plan: SNF    Current Diagnoses: Patient Active Problem List   Diagnosis Date Noted  . Urinary incontinence 07/29/2016  . Constipation 06/25/2016  . Encounter for screening colonoscopy 06/25/2016    Orientation RESPIRATION BLADDER Height & Weight     Self  Normal Continent Weight: 166 lb (75.3 kg) Height:  5\' 9"  (175.3 cm)  BEHAVIORAL SYMPTOMS/MOOD NEUROLOGICAL BOWEL NUTRITION STATUS      Continent Diet(Low potasium)  AMBULATORY STATUS COMMUNICATION OF NEEDS Skin   Extensive Assist Verbally Normal                       Personal Care Assistance Level of Assistance  Bathing, Feeding, Dressing, Total care Bathing Assistance: Limited assistance Feeding assistance: Independent Dressing Assistance: Limited assistance Total Care Assistance: Limited assistance   Functional Limitations Info  Sight, Hearing, Speech Sight Info: Impaired Hearing Info: Impaired Speech Info: Impaired    SPECIAL CARE FACTORS FREQUENCY  PT (By licensed PT), OT (By licensed OT)     PT Frequency: x5 OT Frequency: x5            Contractures Contractures Info: Not present    Additional Factors Info                  Current Medications (05/06/2018):  This is the current hospital active medication list Current  Facility-Administered Medications  Medication Dose Route Frequency Provider Last Rate Last Dose  . aspirin EC tablet 81 mg  81 mg Oral Daily Minna Antis, MD   81 mg at 05/05/18 2030  . atorvastatin (LIPITOR) tablet 20 mg  20 mg Oral q1800 Minna Antis, MD      . doxazosin (CARDURA) tablet 3 mg  3 mg Oral QPM Minna Antis, MD   3 mg at 05/05/18 2031  . feeding supplement (ENSURE ENLIVE) (ENSURE ENLIVE) liquid 237 mL  237 mL Oral BID BM Sharman Cheek, MD      . metFORMIN (GLUCOPHAGE) tablet 500 mg  500 mg Oral Q breakfast Minna Antis, MD       Current Outpatient Medications  Medication Sig Dispense Refill  . azelastine (OPTIVAR) 0.05 % ophthalmic solution Place 1 drop into both eyes 2 (two) times daily.  5  . levothyroxine (SYNTHROID, LEVOTHROID) 50 MCG tablet Take 100 mcg by mouth daily.  3  . lisinopril (PRINIVIL,ZESTRIL) 10 MG tablet Take 10 mg by mouth daily.  3  . LORazepam (ATIVAN) 0.5 MG tablet Take 0.5 mg by mouth daily.  0  . LORazepam (ATIVAN) 0.5 MG tablet Take 0.5 mg by mouth daily as needed for anxiety (agitation). May be given mid-day    . mirtazapine (REMERON) 45 MG tablet Take 45 mg by mouth at bedtime.  1  . OLANZapine (ZYPREXA) 2.5 MG tablet Take 2.5 mg by mouth at bedtime.  0  . aspirin EC 81 MG tablet Take 81 mg by mouth daily.    . Bisacodyl (DULCOLAX PO) Take by mouth as needed.    . doxazosin (CARDURA) 2 MG tablet Take 3 mg by mouth every evening.     . latanoprost (XALATAN) 0.005 % ophthalmic solution Place 1 drop into both eyes at bedtime.     . metFORMIN (GLUCOPHAGE) 500 MG tablet Take 500 mg by mouth daily with breakfast.       Discharge Medications: Please see discharge summary for a list of discharge medications.  Relevant Imaging Results:  Relevant Lab Results:   Additional Information [id number]  Cheron Scha[place], [place]

## 2018-05-06 NOTE — ED Notes (Signed)
Pt resting.  Less anxious.  Sitter at bedside.

## 2018-05-06 NOTE — ED Notes (Signed)
SW at bedside at this time to talk with family.

## 2018-05-06 NOTE — Discharge Instructions (Addendum)
The trazodone can be given once daily at bedtime as needed for agitation or to help with sleep.  Brandon Ochoa should return to the ER at anytime for any new or worsening symptoms that are concerning.

## 2018-05-06 NOTE — Progress Notes (Signed)
LCSW continues to wait for authorization from [employer] and  A call back [employer] spoke to the other facility and patient is declined as they have no male beds.  [employer] LCSW [phone]

## 2018-05-06 NOTE — ED Notes (Signed)
Pt and family aware of possible placement after discharge from hospital.

## 2018-05-06 NOTE — ED Notes (Signed)
Charge nurse made aware that pt is anxious, continuing to pull at lines, wires.  Family needs to leave.  Sitter requested for 30 minutes.  Dr. Marisa Severin does not want to medicate pt again at this time.

## 2018-05-06 NOTE — ED Provider Notes (Signed)
-----------------------------------------   5:14 PM on 05/06/2018 -----------------------------------------  I took over care of this patient from the previous physicians.  LCSW Claudine informed me that the patient has been accepted to [facility], and can be discharged for transport there by EMS.  The patient received trazodone last night with a very good response, so I will provide a week's supply of trazodone.  The patient is stable for discharge at this time.   Dionne Bucy, MD 05/06/18 1715

## 2018-05-06 NOTE — ED Notes (Signed)
Pt placed on hospital bed.  Iv in place. Sinus on monitor.

## 2018-05-06 NOTE — ED Notes (Signed)
Informed Dr. Scotty Court that the patient is not wanting to eat anything or drink anything at this time. Pt stated he would drink some Ensure

## 2018-05-06 NOTE — ED Notes (Signed)
Pt drank 1/2 bottle of Ensure.  Attempted to give pt Metformin as ordered.  Family says he does not have DM nor does he usually take this drug.  Spoke with Dr. Alinda Deem.  Medication withheld.

## 2018-05-06 NOTE — ED Notes (Signed)
Pt anxious, pulling at lines and wires.

## 2018-05-06 NOTE — Progress Notes (Signed)
Passr number obtained  [id number] A resent via the HUB, still awaiting authorization from HTA   Khai Arrona [place] LCSW [phone]

## 2018-05-06 NOTE — ED Notes (Addendum)
Password for phone calls:  [place]

## 2018-05-06 NOTE — ED Notes (Signed)
Called Dietary- requested Ensure be sent to pt.

## 2018-05-06 NOTE — ED Notes (Signed)
Pt given meal tray.

## 2018-05-06 NOTE — ED Notes (Signed)
Provided update to family re: patient.  Informed them the SW would be in soon to talk with them.  TV remote given. Pt resting quietly with eyes closed at this time.  Respirations equal and unlabored.  Breakfast tray placed at bedside.

## 2018-05-06 NOTE — Progress Notes (Signed)
LCSW met with family and they are accepting the bed offer from [facility]- their second choice. LCSW provided family with additional resources for this patients future needs ( locked facilities)  Spoke to [place] at [facility] and she has provided room number 42 and patient will transport by [place] and left another voice message inquiring for authorization number. ( STILL WAITING HTA)  Call report number is [place] LCSW [phone]

## 2018-05-06 NOTE — Progress Notes (Signed)
LCSW faxing all supporting documentation to [facility] to obtain a passr number for this patient. [place] returned this worker back and patient is declined as they have no male beds  Marketa Midkiff LCSW [phone]

## 2018-05-06 NOTE — Evaluation (Signed)
Physical Therapy Evaluation Patient Details Name: Brandon Ochoa MRN: [id number] DOB: 1930/07/13 Today's Date: 05/06/2018   History of Present Illness  Pt is an 84 y.o. male presenting to ED 05/05/18 with increased weakness and fall with L hip pain; ants found on pt.  Pelvic DG negative for fx.  CT of head negative for acute intracranial abnormality.  PMH includes CAD, DM, htn, HLD, dementia, inguinal hernia repair.  Clinical Impression  Prior to hospital admission, pt was ambulatory (tended to hold onto furniture or use SPC sometimes for balance; h/o frequent falls).  Pt lives with his wife in 1 level home with 2 steps to enter with railings.  Currently pt is min to mod assist x2 with bed mobility, min to mod assist x2 with transfers; and min assist x2 to ambulate with RW 16 feet.  Pt requiring vc's and tactile cues for bed mobility, transfers, and ambulation.  No c/o B LE pain during session's activities but did note mild LBP soreness during ambulation.  Pt would benefit from skilled PT to address noted impairments and functional limitations (see below for any additional details).  Currently pt requiring increased physical assist for functional mobility compared to baseline.  Upon hospital discharge, recommend pt discharge to STR (nurse notified).    Follow Up Recommendations SNF    Equipment Recommendations  Rolling walker with 5" wheels    Recommendations for Other Services       Precautions / Restrictions Precautions Precautions: Fall Restrictions Weight Bearing Restrictions: No      Mobility  Bed Mobility Overal bed mobility: Needs Assistance Bed Mobility: Supine to Sit;Sit to Supine     Supine to sit: Min assist;Mod assist;HOB elevated Sit to supine: Min assist;Mod assist;HOB elevated   General bed mobility comments: vc's for technique and tactile cues to initiate movement  Transfers Overall transfer level: Needs assistance Equipment used: Rolling walker (2  wheeled) Transfers: Sit to/from Stand Sit to Stand: Min assist;Mod assist;+2 physical assistance         General transfer comment: vc's and tactile cues for UE/LE placement; assist to initiate stand and to steady initially d/t posterior lean when standing  Ambulation/Gait Ambulation/Gait assistance: Min assist;+2 physical assistance Gait Distance (Feet): 16 Feet Assistive device: Rolling walker (2 wheeled)   Gait velocity: decreased   General Gait Details: forward posture; vc's and assist to stay within walker; decreased B step length/foot clearance/heelstrike  Stairs            Wheelchair Mobility    Modified Rankin (Stroke Patients Only)       Balance Overall balance assessment: Needs assistance Sitting-balance support: Bilateral upper extremity supported;Feet supported Sitting balance-Leahy Scale: Poor Sitting balance - Comments: pt requiring B UE support to maintain sitting posture (close SBA for safety)   Standing balance support: Bilateral upper extremity supported Standing balance-Leahy Scale: Poor Standing balance comment: pt requiring B UE support on RW for static standing balance (initial posterior lean standing requiring assist to shift weight forward)                             Pertinent Vitals/Pain Pain Assessment: Faces Faces Pain Scale: Hurts a little bit Pain Location: low back pain Pain Descriptors / Indicators: Sore Pain Intervention(s): Limited activity within patient's tolerance;Monitored during session;Repositioned  Vitals (HR and O2 on room air) stable and WFL throughout treatment session.    Home Living Family/patient expects to be discharged to:: Private residence Living  Arrangements: Spouse/significant other Available Help at Discharge: Family Type of Home: House Home Access: Stairs to enter Entrance Stairs-Rails: Doctor, general practice of Steps: 2 Home Layout: One level Home Equipment: Environmental consultant - 2 wheels;Cane  - single point      Prior Function Level of Independence: Needs assistance   Gait / Transfers Assistance Needed: Tends to pace a lot inside and to/from mailbox; normally holds onto furniture or use of SPC (tends to to want to use walker)     Comments: H/o frequent falls.     Hand Dominance        Extremity/Trunk Assessment   Upper Extremity Assessment Upper Extremity Assessment: Generalized weakness(Good B hand grip strength; at least 3/5 AROM B elbow flexion/extension and shoulder flexion AROM)    Lower Extremity Assessment Lower Extremity Assessment: Generalized weakness(at least 3/5 B LE hip flexion, knee flexion/extension, and DF/PF AROM; able to perform SLR B LE's with cueing)    Cervical / Trunk Assessment Cervical / Trunk Assessment: Normal  Communication   Communication: No difficulties  Cognition Arousal/Alertness: (Woken with vc's (pt sleeping upon PT entry)) Behavior During Therapy: Flat affect Overall Cognitive Status: History of cognitive impairments - at baseline(Fluctuates with cognitive status per family; A&Ox0 during session)                                        General Comments General comments (skin integrity, edema, etc.): bruising noted L hip.  Nursing cleared pt for participation in physical therapy.  Pt agreeable to PT session.  Pt's wife and son present during session.    Exercises  Bed mobility, transfer, and gait training.   Assessment/Plan    PT Assessment Patient needs continued PT services  PT Problem List Decreased strength;Decreased activity tolerance;Decreased balance;Decreased mobility;Decreased knowledge of use of DME;Decreased safety awareness;Decreased knowledge of precautions;Pain       PT Treatment Interventions DME instruction;Gait training;Stair training;Therapeutic activities;Therapeutic exercise;Balance training;Patient/family education    PT Goals (Current goals can be found in the Care Plan section)   Acute Rehab PT Goals Patient Stated Goal: to improve mobility and balance PT Goal Formulation: With patient/family Time For Goal Achievement: 05/20/18 Potential to Achieve Goals: Fair    Frequency Min 2X/week   Barriers to discharge Decreased caregiver support      Co-evaluation               AM-PAC PT "6 Clicks" Daily Activity  Outcome Measure Difficulty turning over in bed (including adjusting bedclothes, sheets and blankets)?: Unable Difficulty moving from lying on back to sitting on the side of the bed? : Unable Difficulty sitting down on and standing up from a chair with arms (e.g., wheelchair, bedside commode, etc,.)?: Unable Help needed moving to and from a bed to chair (including a wheelchair)?: A Lot Help needed walking in hospital room?: Total Help needed climbing 3-5 steps with a railing? : Total 6 Click Score: 7    End of Session Equipment Utilized During Treatment: Gait belt Activity Tolerance: Patient tolerated treatment well Patient left: in bed;with call bell/phone within reach;with nursing/sitter in room;Other (comment)(nursing present to change pt and nurse notified of need to set bed alarm after pt changed) Nurse Communication: Mobility status;Precautions;Other (comment)(Pt's pain status) PT Visit Diagnosis: Other abnormalities of gait and mobility (R26.89);Muscle weakness (generalized) (M62.81);Repeated falls (R29.6);History of falling (Z91.81);Difficulty in walking, not elsewhere classified (R26.2)    Time: 1610-9604 PT  Time Calculation (min) (ACUTE ONLY): 27 min   Charges:   PT Evaluation $PT Eval Low Complexity: 1 Low PT Treatments $Therapeutic Activity: 8-22 mins       Hendricks Limes, PT 05/06/18, 10:51 AM [phone]

## 2018-05-06 NOTE — Progress Notes (Signed)
Patient is sound asleep and as per notes he has dementia, unable to assess. LCSW called both spouse and son and there was no answer on home line and sons number is not in service. Will await for family to arrive to collect information. Awaiting PT consult.  [employer] LCSW [phone]

## 2018-05-06 NOTE — ED Notes (Signed)
Pt resting quietly with eyes closed, respirations equal and unlabored.  Repeat troponin drawn at this time.  Continue to monitor, waiting for SW and PT.

## 2018-05-06 NOTE — ED Notes (Signed)
Social work at bedside, discussing placement with family and pt.

## 2018-05-07 LAB — URINE CULTURE: Culture: NO GROWTH

## 2018-05-09 DIAGNOSIS — I2510 Atherosclerotic heart disease of native coronary artery without angina pectoris: Secondary | ICD-10-CM | POA: Diagnosis not present

## 2018-05-09 DIAGNOSIS — R1310 Dysphagia, unspecified: Secondary | ICD-10-CM | POA: Diagnosis not present

## 2018-05-09 DIAGNOSIS — F0390 Unspecified dementia without behavioral disturbance: Secondary | ICD-10-CM | POA: Diagnosis not present

## 2018-05-09 DIAGNOSIS — I10 Essential (primary) hypertension: Secondary | ICD-10-CM | POA: Diagnosis not present

## 2018-05-09 DIAGNOSIS — E039 Hypothyroidism, unspecified: Secondary | ICD-10-CM | POA: Diagnosis not present

## 2018-05-10 ENCOUNTER — Other Ambulatory Visit: Payer: Self-pay | Admitting: *Deleted

## 2018-05-10 DIAGNOSIS — R5381 Other malaise: Secondary | ICD-10-CM | POA: Diagnosis not present

## 2018-05-10 DIAGNOSIS — F0390 Unspecified dementia without behavioral disturbance: Secondary | ICD-10-CM | POA: Diagnosis not present

## 2018-05-10 DIAGNOSIS — R296 Repeated falls: Secondary | ICD-10-CM | POA: Diagnosis not present
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
## 2018-05-17 ENCOUNTER — Other Ambulatory Visit: Payer: Self-pay | Admitting: *Deleted

## 2018-05-17 DIAGNOSIS — F0390 Unspecified dementia without behavioral disturbance: Secondary | ICD-10-CM | POA: Diagnosis not present

## 2018-05-17 DIAGNOSIS — R1310 Dysphagia, unspecified: Secondary | ICD-10-CM | POA: Diagnosis not present

## 2018-05-17 DIAGNOSIS — I10 Essential (primary) hypertension: Secondary | ICD-10-CM | POA: Diagnosis not present

## 2018-05-17 DIAGNOSIS — I2510 Atherosclerotic heart disease of native coronary artery without angina pectoris: Secondary | ICD-10-CM | POA: Diagnosis not present

## 2018-05-17 DIAGNOSIS — E039 Hypothyroidism, unspecified: Secondary | ICD-10-CM | POA: Diagnosis not present

## 2018-05-17 NOTE — Patient Outreach (Signed)
[facility]) Care Management  05/17/2018  Brandon Ochoa 06-20-1930 [id number]   Attended interdisciplinary team discharge planning meeting at [facility] with [facility] UM team member Junious Dresser.  PT states patient is working well with therapy physically, and he is building strength.  PT noted patient is still confused and has poor cognition.  Discharge plan discussed as patient plans to return home with his spouse and son.   Went to bedside to speak with patient.  No family present.  Patient awake sitting in wheelchair. Patient ending a PT session, observed patient following simple commands.  Patient able to tell me his spouse's and his son's names, unable to recall date and time.  Asked patient if I could call his spouse to which he stated " sounds ok to me."   Plan to call spouse Carley Hammed and assess for [facility] Care Management needs prior to patient discharge from SNF.   Costella Hatcher RN, BSN [facility]  Post Acute Care Coordinator [phone]) Business Mobile [phone]) Toll free office

## 2018-05-23 DIAGNOSIS — R1312 Dysphagia, oropharyngeal phase: Secondary | ICD-10-CM | POA: Diagnosis not present

## 2018-05-23 DIAGNOSIS — E038 Other specified hypothyroidism: Secondary | ICD-10-CM | POA: Diagnosis not present

## 2018-05-23 DIAGNOSIS — E7849 Other hyperlipidemia: Secondary | ICD-10-CM | POA: Diagnosis not present

## 2018-05-23 DIAGNOSIS — I2510 Atherosclerotic heart disease of native coronary artery without angina pectoris: Secondary | ICD-10-CM | POA: Diagnosis not present

## 2018-05-23 DIAGNOSIS — F0391 Unspecified dementia with behavioral disturbance: Secondary | ICD-10-CM | POA: Diagnosis not present

## 2018-05-23 DIAGNOSIS — K5900 Constipation, unspecified: Secondary | ICD-10-CM | POA: Diagnosis not present

## 2018-05-23 DIAGNOSIS — I10 Essential (primary) hypertension: Secondary | ICD-10-CM | POA: Diagnosis not present

## 2018-05-23 DIAGNOSIS — Z9181 History of falling: Secondary | ICD-10-CM | POA: Diagnosis not present

## 2018-05-23 DIAGNOSIS — E119 Type 2 diabetes mellitus without complications: Secondary | ICD-10-CM | POA: Diagnosis not present

## 2018-05-23 DIAGNOSIS — M25552 Pain in left hip: Secondary | ICD-10-CM | POA: Diagnosis not present

## 2018-05-24 ENCOUNTER — Other Ambulatory Visit: Payer: Self-pay | Admitting: *Deleted

## 2018-05-24 NOTE — Patient Outreach (Signed)
[facility]) Care Management  05/24/2018  Brandon Ochoa Apr 07, 1930 [id number]   Transition of Care Referral   Referral Date: 05/24/18 Referral Source: HTA IP discharge  Date of Admission:  Diagnosis:  Date of Discharge:  on 05/20/18. Facility: [facility] Insurance:  HTA  Outreach attempt # 1 Patient is not able to verify HIPAA He only is stating his name His son, Brandon Ochoa is able to verify HIPAA Reviewed and addressed Transitional of care referral with patient' s son Spoke with his son Brandon Ochoa who is sitting with him while his wife runs errands. Brandon Ochoa reports Brandon Ochoa has had good and bad days. Today Brandon Ochoa told Brandon Ochoa and CM he was "doing  better"but was not able to recognize who Brandon Ochoa was  Brandon Ochoa reports Brandon Ochoa has been sitting in his recliner with his mouth open, drooling and sleeping Brandon Ochoa reports Brandon Ochoa bathed and fed Brandon Ochoa prior to his visit Brandon Ochoa reports trying to get Brandon Ochoa alert enough to get a hair cut but he has not been successful  Answered question about dementia for Brandon Ochoa and provided encouragement with understanding the changes noticed in Brandon Ochoa's behavior Brandon Ochoa states at intervals Brandon Ochoa can be aggressive   Social: Brandon Ochoa lives with is wife who is the primary caregiver.  He has 3 sons who are available to assist prn  He is dependent in all ADLs with some incontinence He is being seen by home health Brandon Ochoa confirms the [facility] RN visited on 05/23/18 and an aide to visit this week  Conditions: CAD, depression, dementia, constipation, urinary incontinence, HTN, DM type 2 hyperlipidemia, former smoker, hypothyroidism, heart attack   Falls frequently per son Last fall with ED visit on 05/05/18   Medications: Brandon Ochoa denies issues with concern with taking medications as prescribed, affording medications, side effects of medications and questions about medications   Appointments: Brandon Ochoa is not aware of f/u appointments  Advance directives:  Brandon Ochoa is not aware of advance directives   Consent: [facility] RN CM reviewed [facility] services with patient. Patient gave verbal consent for services. Advised patient that other post discharge calls may occur to assess how the patient is doing following the recent hospitalization. Patient voiced understanding and was appreciative of f/u call.  Plan: [facility] RN CM will return a call to Brandon Ochoa within the next 4 business days to also assess for possible needs  Jeneva Schweizer L. Noelle Penner, RN, BSN, CCM [facility] Telephonic Care Management Care Coordinator Direct Number [phone] Mobile number [phone]  Main [facility] number [phone] Fax number [phone]

## 2018-05-26 ENCOUNTER — Other Ambulatory Visit: Payer: Self-pay | Admitting: *Deleted

## 2018-05-26 NOTE — Patient Outreach (Signed)
[facility]) Care Management  05/26/2018  Brandon Ochoa 12-10-1929 [id number]   Care coordination. Call to fire department/referrals to [facility] Staff  [facility] RN CM noted a voice message left at 0640 from Brandon Ochoa's wife stating that she "need some help out here. I have not slept for thirteen hours." She states Brandon Ochoa has began "to fight me", "threaten me" and "would not stay in the bed." [facility] RN CM called and spoke with Brandon Dannielle Huh RN , Brandon Ochoa. See notes below in plan   Transition of Care Referral  Referral Date: 05/24/18 Referral Source: HTA IP discharge  Date of Admission:  Diagnosis:  Date of Discharge:  on 05/20/18. Facility: [facility] Insurance:  HTA  Outreach contact #2 Patient is not able to verify HIPAA Brandon Ochoa, wife, is able to verify HIPAA Reviewed and addressed Transitional of care referralwith patient' s wife and discussed receiving her voice message with CM calls to Brandon Dannielle Huh RN, Brandon Ochoa  Brandon Ochoa confirms that she had been informed by her MD office  that home health staff would come out to evaluate him today after 11 am   Brandon Ochoa reports Brandon Ochoa is not eating or sleeping day or night  She report that the MD has ordered medication to assist with sleep but it is causing Brandon Ochoa not to sleep bu to stay more alert (causing Brandon  Ochoa not to get rest)  Cm discussed with her that depending on his type of Dementia/Alzheimer and the stage he may not respond will to certain medicines and this needs to be addressed with his MD CM inquired if Brandon Ochoa had been evaluated by a neurologist. Brandon Bachmeier states Brandon Ochoa had only been seen by Brandon Ochoa and evaluated at a snf for his  Dementia/Alzheimer. She reports she was informed he was in "stage four" Cm discussed this a possible late stage of Dementia/Alzheimer. She voiced understanding  Brandon Selman reports that prior to any noted memory issues., "He always been bossy before hand. Now he will  not eat and will not take his pills  During the call with the wife Brandon Ochoa per wife got up by himself and fell in floor. CM heard Brandon Brandon Ochoa call out to him She voiced concern and frustration.  CM offered to call one of her sons, Brandon Ochoa or the fire department  Her request is for the fire department's assist only if there is not a charge She report having a bill from a previous call for assistance when he fell and the fire dept and Brandon Ochoa arrived. She also states she would not be able to get him out of the floor and her sons had previously directed her not to attempt to prevent injuring herself. CM agreed with this. She states 2 of her sons are at work and another is at a funeral She reports that she is noting Brandon Carline "needs to be cleaned and dried"     Plan [facility] RN CM will refer Brandon Ochoa to [facility] SW (community resources, caregiver respite, possible placement or inpatient palliative) and [facility] RN CM (disease management, level of care, falls, dementia, high ED re-admission risks, dependent with ADLs)  [phone] [facility] RN CM called [facility] and spoke with RN, Brandon Ochoa who confirms [facility] has been contacted to assist with services (RN, PT,OT, ST & aide and hospital bed)  in the home. Brandon Ochoa reports pt did not indicate any aggression in the office but the staff  had been informed of his aggression in the home. CM and office RN discussed whether the spouse is open to discuss placement and/or palliative. CM confirmed that wife has discussed wanting information on these options Cm encouraged re faxing orders to [facility] for clarity  [facility] RN CM spoke with Brandon Ochoa at [place] fire dept to confirm there would be no charge if Brandon Ochoa was not called Brandon Ochoa discussed the fire dept closer to [place] but they would head to the home if CM would call [place] and discuss not Brandon Ochoa per wife. Cm left her contact number Cm spoke with [place] to inform her of pt  fall per [employer] request. Unable to find pt wt listed in Epic on snap shot.  Left Cm contact number Discussed wife not wanting Brandon Ochoa related to charges.   1115 Return call from [place] of [place] fire de[place] to inform Cm that [facility] fire dept assisted pt with getting out of the floor   Brandon Ochoa. Noelle Penner, RN, BSN, CCM [facility] Telephonic Care Management Care Coordinator Office number [phone] Mobile number [phone]  Main [facility] number [phone] Fax number [phone]

## 2018-05-27 ENCOUNTER — Other Ambulatory Visit: Payer: Self-pay | Admitting: *Deleted

## 2018-05-27 NOTE — Patient Outreach (Signed)
[facility]) Care Management  05/27/2018  JAMIAN ANDUJO 1930-03-07 [id number]   Care coordination-f/u voice message and fall =, calls to [facility] & MD    [facility] RN CM received a voice message from Mrs Berg about sending someone to her home to help give Mr Bruntz a bath "this evening or in the morning"  [facility] RN CM returned a call to Mrs Debruhl She is able to verify HIPAA Reviewed and addressed f/u call reasons  Fall f/u -Mrs Toomey confirms Mr Yardley was assisted to get out of the floor on 05/26/18 by [facility] fire department without a charge and without injury. She states she was assisted to change Mr Judice's diaper by the fire department staff. "He fights me when I am changing him."   Mrs Lyness shared that Mr Augello primary stays in the bed most of the day because she is unable to lift or transfer him..She shared that he is in a hospital bed with [facility] rails [facility] but is noted to come out the end of the bed  CM and Mrs Carley Hammed reviewed fall safety measures, to include checking on Mr Mottola q 2 hours if possible for continence, food, repositioning etc. Plus adding a rail at the foot of the bed.  She states her son Kathlene November [facility]) wants to assist by putting a piece of "ply board at th end of the bed to prevent Mr Prabhu from falling She states at this time she has a rocking chair positioned at the end of the bed She report she has given him lorazepam as ordered (Lorazepam/Ativan 1 tab in am for anxiety agitation take 1-2 tab at bed prn) by MD and he his more calmer today. Cm discussed the importance of given the medication only as ordered and reporting increased agitation to MD Mrs Adami reports understanding  Mrs Quevedo is scheduled today to run errands and her oldest son Kathlene November will be sitting with the patient She reports "He talks out of his head all the time." She stated "I don't think it will be long" CM inquired about this statement and she reports she is not sure he  will live long.  CM assessed for s/s but she denied labored breathing, change in skin color, or VS. She report his eyes were "matted this morning." He has had a loss of appetite, confusion ans some episodes of increased sleeping    [facility] - Mrs Tones confirms that Mr Magallanes was evaluated by a male staff "Robbie Louis" on 05/26/18 Contact number and information left for Mrs Carley Hammed [phone]  HH Aide - CM explained to Mrs Polyak again the differences in [facility] She voiced understanding home care She was referred to [facility] for this need CM assisted by guiding Mrs Montfort to find [facility] 's folder. She was noted to call it [facility]" vs [facility]. CM also noted some concerns with difficulty with reading during this process as Mrs Carley Hammed is not able to state words in sentences on the folder   [facility] RN CM with Mrs Orndoff on the line called [facility] about the [facility] aide,  spoke with Renea Ee and Melissa ([place] office [phone]) and referred to MD referred but informed a HH OT would be visiting on 06/06/2018. Melissa also confirmed the initial start of service date was on 05/23/18 and Lauren visited and provided a folder and magnet. CM encouraged Mrs Carley Hammed to put the magnet on the  refrigerator for future use  [facility] RN CM with Mrs Brunetto on the line called Dr Dannielle Huh office and spoke with Alvino Chapel, RN at [phone] about need for orders to be faxed to [facility] for [facility] aide, foot rail for hospital bed Alvino Chapel was given the fax # [phone]  Also discussed his 05/26/18 fall without injury Alvino Chapel confirmed a cancelled MD appointment for today 05/27/18 the was changed to 06/13/18 at 1015  Consent: [facility] RN CM reviewed [facility] services with patient. Patient gave verbal consent for services HTN SW and [facility] RN CM (referrals completed on 05/26/18).   Plans updated [facility] RN CM, Shirlyn Goltz  and SW, [place], via [place] in [employer]. Noelle Penner, RN, BSN, CCM [facility] Telephonic  Care Management Care Coordinator Office number [phone] Mobile number [phone]  Main [facility] number [phone] Fax number [phone]

## 2018-05-30 ENCOUNTER — Other Ambulatory Visit: Payer: Self-pay | Admitting: *Deleted

## 2018-05-30 NOTE — Patient Outreach (Signed)
[facility]) Care Management  [facility] Care Manager  06/16/2018   Brandon Ochoa 1930-02-29 [id number]    Referral Date 11/7 Referral source : [facility] Telephonic Care manager  Referral reason : Discharge from [facility] 11/7 Diagnosis : Dementia, Patient admitted  to [facility] health care on 10/18  after ED visit on 10/17 to [facility]  PMHx review, includes but not limited to Dementia ,   Subjective:   Successful outreach call to patient wife, Brandon Ochoa she reports that patient passed away about 6 am this morning . Condolences offered, she states that her son and family  was with her now .   Mrs.Bicknell asked questions about the hospital bed supplied by [facility] and when that would be picked up she asked for assistance in notifying them. Placed call to [facility] [phone], able to speak with Toni Amend that will arrange contact to wife, best contact number verified to arrange bed pick up.   Placed call to [facility] , spoke with stephanie to inform of patient passing and previous [facility] Care manager note that [facility] OT visit scheduled for 11/11, she states that she will notify clinical team.   Plan:  Will close case , patient deceased and  will send PCP case closure letter .   Egbert Garibaldi, RN, [facility] Care Management,Care Management Coordinator  [phone]- Mobile [phone]- Toll Free Main Office

## 2018-05-31 ENCOUNTER — Other Ambulatory Visit: Payer: Self-pay | Admitting: *Deleted

## 2018-05-31 NOTE — Patient Outreach (Signed)
[facility]) Care Management  05/31/2018  Brandon Ochoa 1929/11/12 [id number]   Care coordination   [name] called CM but disconnected during the call [facility] RN CM called her back  She is requesting the hospital bed be picked up by the home health agency Cm reminded her that [facility] was not the home health agency but CM could assist by leaving another message at [facility]  She voices she has a cold and states Mr Stickle's passing is "getting to me" CM noted hoarseness in the sound of her voice She was encouraged to call her MD office She states she maybe able to stop by the MD office on her way to the funeral home   Plan [facility] RN CM will call [facility] and MD office for [name]  CM spoke with Barbara Cower at [facility] at [phone] to request the bed be picked up from the patient's home. Cm and Barbara Cower reviewed the contact information for the wife and son [facility] RN CM spoke with Candise Bowens at Dr Anna Genre office to discuss the concerns with [name] cold, hoarseness and not doing well after the passing of Mr Zaccone. Message left     [place] L. Noelle Penner, RN, BSN, CCM [facility] Telephonic Care Management Care Coordinator Office number [phone] Mobile number [phone]  Main [facility] number [phone] Fax number [phone]

## 2018-06-19 DEATH — deceased
